# Patient Record
Sex: Female | Born: 1945 | Race: Black or African American | Hispanic: No | Marital: Married | Smoking: Former smoker
Health system: Southern US, Community
[De-identification: ages and names within clinical notes are randomized; demographics above are authoritative.]

## PROBLEM LIST (undated history)

## (undated) DIAGNOSIS — D214 Benign neoplasm of connective and other soft tissue of abdomen: Secondary | ICD-10-CM

## (undated) DIAGNOSIS — C189 Malignant neoplasm of colon, unspecified: Secondary | ICD-10-CM

## (undated) DIAGNOSIS — D249 Benign neoplasm of unspecified breast: Secondary | ICD-10-CM

## (undated) DIAGNOSIS — I10 Essential (primary) hypertension: Secondary | ICD-10-CM

## (undated) DIAGNOSIS — E119 Type 2 diabetes mellitus without complications: Secondary | ICD-10-CM

## (undated) HISTORY — DX: Benign neoplasm of connective and other soft tissue of abdomen: D21.4

## (undated) HISTORY — DX: Type 2 diabetes mellitus without complications: E11.9

## (undated) HISTORY — DX: Essential (primary) hypertension: I10

## (undated) HISTORY — DX: Malignant neoplasm of colon, unspecified: C18.9

## (undated) HISTORY — DX: Benign neoplasm of unspecified breast: D24.9

---

## 2006-05-05 ENCOUNTER — Ambulatory Visit: Payer: Self-pay | Admitting: Oncology

## 2006-05-17 LAB — COMPREHENSIVE METABOLIC PANEL
AST: 19 U/L (ref 0–37)
Alkaline Phosphatase: 87 U/L (ref 39–117)
BUN: 10 mg/dL (ref 6–23)
Creatinine, Ser: 0.88 mg/dL (ref 0.40–1.20)
Total Bilirubin: 0.6 mg/dL (ref 0.3–1.2)

## 2006-05-17 LAB — CBC WITH DIFFERENTIAL/PLATELET
BASO%: 1.5 % (ref 0.0–2.0)
Basophils Absolute: 0.1 10*3/uL (ref 0.0–0.1)
EOS%: 2.5 % (ref 0.0–7.0)
HGB: 12.9 g/dL (ref 11.6–15.9)
MCH: 29.2 pg (ref 26.0–34.0)
MCHC: 33.5 g/dL (ref 32.0–36.0)
RDW: 13.8 % (ref 11.3–14.5)
WBC: 3.9 10*3/uL (ref 3.9–10.0)
lymph#: 1.1 10*3/uL (ref 0.9–3.3)

## 2006-05-17 LAB — PROTIME-INR: INR: 1.1 — ABNORMAL LOW (ref 2.00–3.50)

## 2006-06-21 ENCOUNTER — Ambulatory Visit: Payer: Self-pay | Admitting: Oncology

## 2006-08-24 ENCOUNTER — Ambulatory Visit: Payer: Self-pay | Admitting: Oncology

## 2006-08-24 LAB — COMPREHENSIVE METABOLIC PANEL
ALT: 18 U/L (ref 0–40)
AST: 24 U/L (ref 0–37)
Albumin: 3.6 g/dL (ref 3.5–5.2)
Alkaline Phosphatase: 78 U/L (ref 39–117)
BUN: 9 mg/dL (ref 6–23)
CO2: 28 mEq/L (ref 19–32)
Calcium: 9.4 mg/dL (ref 8.4–10.5)
Chloride: 100 mEq/L (ref 96–112)
Creatinine, Ser: 0.9 mg/dL (ref 0.40–1.20)
Glucose, Bld: 149 mg/dL — ABNORMAL HIGH (ref 70–99)
Potassium: 3.6 mEq/L (ref 3.5–5.3)
Sodium: 134 mEq/L — ABNORMAL LOW (ref 135–145)
Total Bilirubin: 1.6 mg/dL — ABNORMAL HIGH (ref 0.3–1.2)
Total Protein: 6 g/dL (ref 6.0–8.3)

## 2006-08-24 LAB — CBC WITH DIFFERENTIAL/PLATELET
Basophils Absolute: 0 10*3/uL (ref 0.0–0.1)
EOS%: 1.5 % (ref 0.0–7.0)
Eosinophils Absolute: 0.1 10*3/uL (ref 0.0–0.5)
HCT: 39 % (ref 34.8–46.6)
HGB: 13.6 g/dL (ref 11.6–15.9)
MONO#: 0.7 10*3/uL (ref 0.1–0.9)
NEUT#: 3.3 10*3/uL (ref 1.5–6.5)
RDW: 26.8 % — ABNORMAL HIGH (ref 11.3–14.5)
WBC: 5.4 10*3/uL (ref 3.9–10.0)
lymph#: 1.3 10*3/uL (ref 0.9–3.3)

## 2006-09-28 LAB — COMPREHENSIVE METABOLIC PANEL
BUN: 8 mg/dL (ref 6–23)
CO2: 26 mEq/L (ref 19–32)
Calcium: 9.6 mg/dL (ref 8.4–10.5)
Chloride: 107 mEq/L (ref 96–112)
Creatinine, Ser: 0.88 mg/dL (ref 0.40–1.20)
Total Bilirubin: 0.9 mg/dL (ref 0.3–1.2)

## 2006-09-28 LAB — CBC WITH DIFFERENTIAL/PLATELET
Basophils Absolute: 0 10*3/uL (ref 0.0–0.1)
HCT: 37.2 % (ref 34.8–46.6)
HGB: 12.6 g/dL (ref 11.6–15.9)
LYMPH%: 26.2 % (ref 14.0–48.0)
MCH: 36.2 pg — ABNORMAL HIGH (ref 26.0–34.0)
MONO#: 0.6 10*3/uL (ref 0.1–0.9)
NEUT%: 60.8 % (ref 39.6–76.8)
Platelets: 235 10*3/uL (ref 145–400)
WBC: 5.2 10*3/uL (ref 3.9–10.0)
lymph#: 1.4 10*3/uL (ref 0.9–3.3)

## 2006-09-28 LAB — LACTATE DEHYDROGENASE: LDH: 188 U/L (ref 94–250)

## 2006-10-26 ENCOUNTER — Ambulatory Visit: Payer: Self-pay | Admitting: Oncology

## 2006-10-31 LAB — COMPREHENSIVE METABOLIC PANEL
ALT: 19 U/L (ref 0–35)
AST: 24 U/L (ref 0–37)
Albumin: 4.2 g/dL (ref 3.5–5.2)
Calcium: 9.2 mg/dL (ref 8.4–10.5)
Chloride: 111 mEq/L (ref 96–112)
Potassium: 3.3 mEq/L — ABNORMAL LOW (ref 3.5–5.3)
Total Protein: 6.6 g/dL (ref 6.0–8.3)

## 2006-10-31 LAB — CBC WITH DIFFERENTIAL/PLATELET
BASO%: 0.5 % (ref 0.0–2.0)
EOS%: 1 % (ref 0.0–7.0)
HGB: 12.6 g/dL (ref 11.6–15.9)
MCH: 36.6 pg — ABNORMAL HIGH (ref 26.0–34.0)
MCHC: 34.3 g/dL (ref 32.0–36.0)
RDW: 16.6 % — ABNORMAL HIGH (ref 11.3–14.5)
WBC: 5.4 10*3/uL (ref 3.9–10.0)
lymph#: 2.1 10*3/uL (ref 0.9–3.3)

## 2006-11-16 ENCOUNTER — Ambulatory Visit (HOSPITAL_COMMUNITY): Admission: RE | Admit: 2006-11-16 | Discharge: 2006-11-16 | Payer: Self-pay | Admitting: Oncology

## 2007-02-22 ENCOUNTER — Ambulatory Visit: Payer: Self-pay | Admitting: Oncology

## 2007-02-27 LAB — COMPREHENSIVE METABOLIC PANEL
ALT: 24 U/L (ref 0–35)
Albumin: 4.5 g/dL (ref 3.5–5.2)
CO2: 28 mEq/L (ref 19–32)
Chloride: 106 mEq/L (ref 96–112)
Glucose, Bld: 105 mg/dL — ABNORMAL HIGH (ref 70–99)
Potassium: 4.2 mEq/L (ref 3.5–5.3)
Sodium: 144 mEq/L (ref 135–145)
Total Bilirubin: 0.6 mg/dL (ref 0.3–1.2)
Total Protein: 7.2 g/dL (ref 6.0–8.3)

## 2007-02-27 LAB — LACTATE DEHYDROGENASE: LDH: 148 U/L (ref 94–250)

## 2007-02-27 LAB — CBC WITH DIFFERENTIAL/PLATELET
Basophils Absolute: 0.1 10*3/uL (ref 0.0–0.1)
Eosinophils Absolute: 0.1 10*3/uL (ref 0.0–0.5)
HCT: 41.1 % (ref 34.8–46.6)
HGB: 14.4 g/dL (ref 11.6–15.9)
LYMPH%: 35.8 % (ref 14.0–48.0)
MCV: 90.7 fL (ref 81.0–101.0)
MONO%: 13.5 % — ABNORMAL HIGH (ref 0.0–13.0)
NEUT#: 2.3 10*3/uL (ref 1.5–6.5)
NEUT%: 47.6 % (ref 39.6–76.8)
Platelets: 211 10*3/uL (ref 145–400)
RDW: 12.9 % (ref 11.3–14.5)

## 2007-04-14 ENCOUNTER — Encounter: Admission: RE | Admit: 2007-04-14 | Discharge: 2007-04-14 | Payer: Self-pay | Admitting: Oncology

## 2007-04-21 ENCOUNTER — Encounter: Admission: RE | Admit: 2007-04-21 | Discharge: 2007-04-21 | Payer: Self-pay | Admitting: Surgery

## 2007-04-21 ENCOUNTER — Encounter (INDEPENDENT_AMBULATORY_CARE_PROVIDER_SITE_OTHER): Payer: Self-pay | Admitting: Radiology

## 2007-06-22 ENCOUNTER — Ambulatory Visit: Payer: Self-pay | Admitting: Oncology

## 2007-06-26 LAB — CBC WITH DIFFERENTIAL/PLATELET
HGB: 13.5 g/dL (ref 11.6–15.9)
LYMPH%: 28.2 % (ref 14.0–48.0)
MCH: 31.4 pg (ref 26.0–34.0)
MCHC: 35.2 g/dL (ref 32.0–36.0)
MCV: 89.1 fL (ref 81.0–101.0)
RDW: 13.7 % (ref 11.3–14.5)

## 2007-06-26 LAB — COMPREHENSIVE METABOLIC PANEL
ALT: 30 U/L (ref 0–35)
Albumin: 4.2 g/dL (ref 3.5–5.2)
CO2: 25 mEq/L (ref 19–32)
Calcium: 9.5 mg/dL (ref 8.4–10.5)
Chloride: 108 mEq/L (ref 96–112)
Glucose, Bld: 85 mg/dL (ref 70–99)
Sodium: 143 mEq/L (ref 135–145)
Total Bilirubin: 0.7 mg/dL (ref 0.3–1.2)
Total Protein: 6.9 g/dL (ref 6.0–8.3)

## 2007-06-26 LAB — LACTATE DEHYDROGENASE: LDH: 153 U/L (ref 94–250)

## 2007-10-03 ENCOUNTER — Encounter: Admission: RE | Admit: 2007-10-03 | Discharge: 2007-10-03 | Payer: Self-pay | Admitting: Oncology

## 2007-12-14 ENCOUNTER — Ambulatory Visit: Payer: Self-pay | Admitting: Oncology

## 2007-12-18 LAB — COMPREHENSIVE METABOLIC PANEL
AST: 27 U/L (ref 0–37)
BUN: 9 mg/dL (ref 6–23)
Calcium: 9.3 mg/dL (ref 8.4–10.5)
Chloride: 105 mEq/L (ref 96–112)
Creatinine, Ser: 0.86 mg/dL (ref 0.40–1.20)

## 2007-12-18 LAB — CBC WITH DIFFERENTIAL/PLATELET
Basophils Absolute: 0 10*3/uL (ref 0.0–0.1)
EOS%: 1.7 % (ref 0.0–7.0)
HCT: 40.9 % (ref 34.8–46.6)
HGB: 13.9 g/dL (ref 11.6–15.9)
LYMPH%: 31.8 % (ref 14.0–48.0)
MCH: 30.2 pg (ref 26.0–34.0)
MCV: 88.8 fL (ref 81.0–101.0)
MONO%: 7.8 % (ref 0.0–13.0)
NEUT%: 58.2 % (ref 39.6–76.8)
Platelets: 201 10*3/uL (ref 145–400)
lymph#: 1.6 10*3/uL (ref 0.9–3.3)

## 2007-12-18 LAB — LACTATE DEHYDROGENASE: LDH: 160 U/L (ref 94–250)

## 2007-12-20 ENCOUNTER — Ambulatory Visit (HOSPITAL_COMMUNITY): Admission: RE | Admit: 2007-12-20 | Discharge: 2007-12-20 | Payer: Self-pay | Admitting: Oncology

## 2008-04-19 ENCOUNTER — Encounter: Admission: RE | Admit: 2008-04-19 | Discharge: 2008-04-19 | Payer: Self-pay | Admitting: Oncology

## 2008-06-19 ENCOUNTER — Ambulatory Visit: Payer: Self-pay | Admitting: Oncology

## 2008-06-28 LAB — CBC WITH DIFFERENTIAL/PLATELET
BASO%: 0.4 % (ref 0.0–2.0)
Basophils Absolute: 0 10*3/uL (ref 0.0–0.1)
EOS%: 2.2 % (ref 0.0–7.0)
HCT: 41.8 % (ref 34.8–46.6)
HGB: 14.4 g/dL (ref 11.6–15.9)
MONO#: 0.6 10*3/uL (ref 0.1–0.9)
NEUT%: 57.8 % (ref 39.6–76.8)
RDW: 13.8 % (ref 11.3–14.5)
WBC: 5.4 10*3/uL (ref 3.9–10.0)
lymph#: 1.5 10*3/uL (ref 0.9–3.3)

## 2008-06-28 LAB — COMPREHENSIVE METABOLIC PANEL
ALT: 17 U/L (ref 0–35)
AST: 15 U/L (ref 0–37)
Albumin: 4.6 g/dL (ref 3.5–5.2)
BUN: 15 mg/dL (ref 6–23)
CO2: 28 mEq/L (ref 19–32)
Calcium: 9.6 mg/dL (ref 8.4–10.5)
Chloride: 104 mEq/L (ref 96–112)
Creatinine, Ser: 1.08 mg/dL (ref 0.40–1.20)
Potassium: 4 mEq/L (ref 3.5–5.3)

## 2008-06-28 LAB — LACTATE DEHYDROGENASE: LDH: 139 U/L (ref 94–250)

## 2008-06-28 LAB — CEA: CEA: 1.3 ng/mL (ref 0.0–5.0)

## 2008-07-05 ENCOUNTER — Ambulatory Visit (HOSPITAL_COMMUNITY): Admission: RE | Admit: 2008-07-05 | Discharge: 2008-07-05 | Payer: Self-pay | Admitting: Oncology

## 2008-12-27 ENCOUNTER — Ambulatory Visit: Payer: Self-pay | Admitting: Oncology

## 2009-02-25 ENCOUNTER — Ambulatory Visit: Payer: Self-pay | Admitting: Oncology

## 2009-02-28 LAB — COMPREHENSIVE METABOLIC PANEL
Alkaline Phosphatase: 88 U/L (ref 39–117)
BUN: 9 mg/dL (ref 6–23)
CO2: 31 mEq/L (ref 19–32)
Creatinine, Ser: 0.91 mg/dL (ref 0.40–1.20)
Glucose, Bld: 143 mg/dL — ABNORMAL HIGH (ref 70–99)
Total Bilirubin: 0.6 mg/dL (ref 0.3–1.2)

## 2009-02-28 LAB — CBC WITH DIFFERENTIAL/PLATELET
BASO%: 0.9 % (ref 0.0–2.0)
LYMPH%: 29.2 % (ref 14.0–49.7)
MCHC: 34.4 g/dL (ref 31.5–36.0)
MCV: 88.2 fL (ref 79.5–101.0)
MONO%: 9 % (ref 0.0–14.0)
NEUT%: 59.2 % (ref 38.4–76.8)
Platelets: 184 10*3/uL (ref 145–400)
RBC: 4.36 10*6/uL (ref 3.70–5.45)

## 2009-02-28 LAB — CEA: CEA: 1.3 ng/mL (ref 0.0–5.0)

## 2009-02-28 LAB — LACTATE DEHYDROGENASE: LDH: 125 U/L (ref 94–250)

## 2009-03-11 ENCOUNTER — Ambulatory Visit (HOSPITAL_COMMUNITY): Admission: RE | Admit: 2009-03-11 | Discharge: 2009-03-11 | Payer: Self-pay | Admitting: Oncology

## 2009-09-17 ENCOUNTER — Ambulatory Visit: Payer: Self-pay | Admitting: Oncology

## 2009-09-19 LAB — COMPREHENSIVE METABOLIC PANEL
ALT: 15 U/L (ref 0–35)
AST: 17 U/L (ref 0–37)
Albumin: 4.2 g/dL (ref 3.5–5.2)
Calcium: 9.7 mg/dL (ref 8.4–10.5)
Chloride: 100 mEq/L (ref 96–112)
Potassium: 3.6 mEq/L (ref 3.5–5.3)
Sodium: 139 mEq/L (ref 135–145)
Total Protein: 7.3 g/dL (ref 6.0–8.3)

## 2009-09-19 LAB — CBC WITH DIFFERENTIAL/PLATELET
BASO%: 0.3 % (ref 0.0–2.0)
Basophils Absolute: 0 10*3/uL (ref 0.0–0.1)
EOS%: 1.2 % (ref 0.0–7.0)
HGB: 13.5 g/dL (ref 11.6–15.9)
MCH: 30.1 pg (ref 25.1–34.0)
RBC: 4.48 10*6/uL (ref 3.70–5.45)
RDW: 13.2 % (ref 11.2–14.5)
lymph#: 1.9 10*3/uL (ref 0.9–3.3)

## 2010-03-13 ENCOUNTER — Ambulatory Visit: Payer: Self-pay | Admitting: Oncology

## 2010-03-13 ENCOUNTER — Ambulatory Visit (HOSPITAL_COMMUNITY): Admission: RE | Admit: 2010-03-13 | Discharge: 2010-03-13 | Payer: Self-pay | Admitting: Oncology

## 2010-03-13 LAB — CBC WITH DIFFERENTIAL/PLATELET
BASO%: 0.7 % (ref 0.0–2.0)
EOS%: 2.4 % (ref 0.0–7.0)
HCT: 41 % (ref 34.8–46.6)
HGB: 13.9 g/dL (ref 11.6–15.9)
LYMPH%: 35 % (ref 14.0–49.7)
MCV: 90 fL (ref 79.5–101.0)
NEUT#: 2.7 10*3/uL (ref 1.5–6.5)
WBC: 5.3 10*3/uL (ref 3.9–10.3)

## 2010-03-13 LAB — COMPREHENSIVE METABOLIC PANEL
ALT: 25 U/L (ref 0–35)
AST: 25 U/L (ref 0–37)
Calcium: 9.7 mg/dL (ref 8.4–10.5)
Chloride: 100 mEq/L (ref 96–112)
Glucose, Bld: 224 mg/dL — ABNORMAL HIGH (ref 70–99)
Potassium: 3.7 mEq/L (ref 3.5–5.3)
Total Bilirubin: 0.7 mg/dL (ref 0.3–1.2)

## 2010-03-13 LAB — LACTATE DEHYDROGENASE: LDH: 134 U/L (ref 94–250)

## 2010-09-09 ENCOUNTER — Ambulatory Visit: Payer: Self-pay | Admitting: Oncology

## 2010-09-11 LAB — COMPREHENSIVE METABOLIC PANEL
ALT: 11 U/L (ref 0–35)
Calcium: 10.4 mg/dL (ref 8.4–10.5)
Chloride: 106 mEq/L (ref 96–112)
Creatinine, Ser: 0.85 mg/dL (ref 0.40–1.20)
Glucose, Bld: 104 mg/dL — ABNORMAL HIGH (ref 70–99)
Potassium: 4 mEq/L (ref 3.5–5.3)
Sodium: 140 mEq/L (ref 135–145)

## 2010-09-11 LAB — CBC WITH DIFFERENTIAL/PLATELET
BASO%: 0.4 % (ref 0.0–2.0)
Basophils Absolute: 0 10*3/uL (ref 0.0–0.1)
EOS%: 1.9 % (ref 0.0–7.0)
MCHC: 33.4 g/dL (ref 31.5–36.0)
MONO%: 10.8 % (ref 0.0–14.0)
NEUT#: 2.8 10*3/uL (ref 1.5–6.5)
NEUT%: 55.8 % (ref 38.4–76.8)
Platelets: 237 10*3/uL (ref 145–400)
lymph#: 1.6 10*3/uL (ref 0.9–3.3)

## 2010-09-11 LAB — CEA: CEA: 1.4 ng/mL (ref 0.0–5.0)

## 2010-12-19 ENCOUNTER — Other Ambulatory Visit: Payer: Self-pay | Admitting: Oncology

## 2010-12-19 DIAGNOSIS — C189 Malignant neoplasm of colon, unspecified: Secondary | ICD-10-CM

## 2010-12-20 ENCOUNTER — Encounter: Payer: Self-pay | Admitting: Oncology

## 2011-03-22 ENCOUNTER — Other Ambulatory Visit: Payer: Self-pay | Admitting: Oncology

## 2011-03-22 ENCOUNTER — Encounter (HOSPITAL_BASED_OUTPATIENT_CLINIC_OR_DEPARTMENT_OTHER): Payer: BC Managed Care – PPO | Admitting: Oncology

## 2011-03-22 DIAGNOSIS — C189 Malignant neoplasm of colon, unspecified: Secondary | ICD-10-CM

## 2011-03-22 DIAGNOSIS — C187 Malignant neoplasm of sigmoid colon: Secondary | ICD-10-CM

## 2011-03-22 DIAGNOSIS — E119 Type 2 diabetes mellitus without complications: Secondary | ICD-10-CM

## 2011-03-22 LAB — CBC WITH DIFFERENTIAL/PLATELET
Basophils Absolute: 0.1 10*3/uL (ref 0.0–0.1)
EOS%: 1.7 % (ref 0.0–7.0)
Eosinophils Absolute: 0.1 10*3/uL (ref 0.0–0.5)
LYMPH%: 25.2 % (ref 14.0–49.7)
MCH: 30.1 pg (ref 25.1–34.0)
MCHC: 33.9 g/dL (ref 31.5–36.0)
MONO#: 0.3 10*3/uL (ref 0.1–0.9)
NEUT#: 3 10*3/uL (ref 1.5–6.5)
NEUT%: 63.5 % (ref 38.4–76.8)
WBC: 4.8 10*3/uL (ref 3.9–10.3)

## 2011-03-22 LAB — COMPREHENSIVE METABOLIC PANEL
ALT: 16 U/L (ref 0–35)
Albumin: 4 g/dL (ref 3.5–5.2)
Glucose, Bld: 210 mg/dL — ABNORMAL HIGH (ref 70–99)
Potassium: 3.5 mEq/L (ref 3.5–5.3)
Sodium: 142 mEq/L (ref 135–145)
Total Bilirubin: 0.5 mg/dL (ref 0.3–1.2)
Total Protein: 7.1 g/dL (ref 6.0–8.3)

## 2011-04-16 ENCOUNTER — Encounter (HOSPITAL_COMMUNITY): Payer: Self-pay

## 2011-04-16 ENCOUNTER — Ambulatory Visit (HOSPITAL_COMMUNITY)
Admission: RE | Admit: 2011-04-16 | Discharge: 2011-04-16 | Disposition: A | Discharge status: Home / Self Care | Payer: BC Managed Care – PPO | Source: Ambulatory Visit | Attending: Oncology | Admitting: Oncology

## 2011-04-16 DIAGNOSIS — J984 Other disorders of lung: Secondary | ICD-10-CM | POA: Insufficient documentation

## 2011-04-16 DIAGNOSIS — C189 Malignant neoplasm of colon, unspecified: Secondary | ICD-10-CM | POA: Insufficient documentation

## 2011-04-16 DIAGNOSIS — D259 Leiomyoma of uterus, unspecified: Secondary | ICD-10-CM | POA: Insufficient documentation

## 2011-04-16 DIAGNOSIS — Q618 Other cystic kidney diseases: Secondary | ICD-10-CM | POA: Insufficient documentation

## 2011-04-16 DIAGNOSIS — M7989 Other specified soft tissue disorders: Secondary | ICD-10-CM | POA: Insufficient documentation

## 2011-04-16 HISTORY — DX: Malignant neoplasm of colon, unspecified: C18.9

## 2011-04-16 HISTORY — DX: Essential (primary) hypertension: I10

## 2011-04-16 MED ORDER — IOHEXOL 300 MG/ML  SOLN
100.0000 mL | Freq: Once | INTRAMUSCULAR | Status: AC | PRN
  Administered 2011-04-16: 100 mL via INTRAVENOUS

## 2011-09-06 ENCOUNTER — Other Ambulatory Visit (HOSPITAL_COMMUNITY): Payer: Self-pay

## 2011-09-24 ENCOUNTER — Other Ambulatory Visit: Payer: Self-pay | Admitting: Oncology

## 2011-09-24 ENCOUNTER — Encounter (HOSPITAL_BASED_OUTPATIENT_CLINIC_OR_DEPARTMENT_OTHER): Payer: BC Managed Care – PPO | Admitting: Oncology

## 2011-09-24 DIAGNOSIS — C187 Malignant neoplasm of sigmoid colon: Secondary | ICD-10-CM

## 2011-09-24 DIAGNOSIS — Z85038 Personal history of other malignant neoplasm of large intestine: Secondary | ICD-10-CM

## 2011-09-24 DIAGNOSIS — E119 Type 2 diabetes mellitus without complications: Secondary | ICD-10-CM

## 2011-09-24 LAB — CBC WITH DIFFERENTIAL/PLATELET
BASO%: 1.7 % (ref 0.0–2.0)
EOS%: 1.1 % (ref 0.0–7.0)
HCT: 38.8 % (ref 34.8–46.6)
LYMPH%: 27.9 % (ref 14.0–49.7)
MCH: 29.7 pg (ref 25.1–34.0)
MCHC: 33.8 g/dL (ref 31.5–36.0)
MCV: 87.9 fL (ref 79.5–101.0)
MONO%: 8.3 % (ref 0.0–14.0)
NEUT%: 61 % (ref 38.4–76.8)
Platelets: 241 10*3/uL (ref 145–400)
RBC: 4.41 10*6/uL (ref 3.70–5.45)
WBC: 5.7 10*3/uL (ref 3.9–10.3)

## 2011-09-24 LAB — COMPREHENSIVE METABOLIC PANEL
ALT: 14 U/L (ref 0–35)
AST: 16 U/L (ref 0–37)
Alkaline Phosphatase: 83 U/L (ref 39–117)
CO2: 23 mEq/L (ref 19–32)
Creatinine, Ser: 0.84 mg/dL (ref 0.50–1.10)
Sodium: 139 mEq/L (ref 135–145)
Total Bilirubin: 0.4 mg/dL (ref 0.3–1.2)
Total Protein: 7 g/dL (ref 6.0–8.3)

## 2011-09-24 LAB — LACTATE DEHYDROGENASE: LDH: 127 U/L (ref 94–250)

## 2012-08-25 ENCOUNTER — Telehealth: Payer: Self-pay | Admitting: Oncology

## 2012-08-25 NOTE — Telephone Encounter (Signed)
lmonvm adviisng the pt of her nov 2013 appts

## 2012-10-25 ENCOUNTER — Other Ambulatory Visit: Payer: Self-pay | Admitting: *Deleted

## 2012-10-25 DIAGNOSIS — C189 Malignant neoplasm of colon, unspecified: Secondary | ICD-10-CM

## 2012-10-27 ENCOUNTER — Other Ambulatory Visit (HOSPITAL_BASED_OUTPATIENT_CLINIC_OR_DEPARTMENT_OTHER): Payer: BC Managed Care – PPO

## 2012-10-27 ENCOUNTER — Telehealth: Payer: Self-pay | Admitting: Oncology

## 2012-10-27 ENCOUNTER — Encounter: Payer: Self-pay | Admitting: Oncology

## 2012-10-27 ENCOUNTER — Ambulatory Visit (HOSPITAL_BASED_OUTPATIENT_CLINIC_OR_DEPARTMENT_OTHER): Payer: BC Managed Care – PPO | Admitting: Oncology

## 2012-10-27 VITALS — BP 134/73 | HR 93 | Temp 98.2°F | Resp 20 | Ht 66.0 in | Wt 179.8 lb

## 2012-10-27 DIAGNOSIS — D214 Benign neoplasm of connective and other soft tissue of abdomen: Secondary | ICD-10-CM | POA: Insufficient documentation

## 2012-10-27 DIAGNOSIS — C187 Malignant neoplasm of sigmoid colon: Secondary | ICD-10-CM

## 2012-10-27 DIAGNOSIS — E119 Type 2 diabetes mellitus without complications: Secondary | ICD-10-CM

## 2012-10-27 DIAGNOSIS — D249 Benign neoplasm of unspecified breast: Secondary | ICD-10-CM | POA: Insufficient documentation

## 2012-10-27 DIAGNOSIS — C189 Malignant neoplasm of colon, unspecified: Secondary | ICD-10-CM

## 2012-10-27 DIAGNOSIS — I10 Essential (primary) hypertension: Secondary | ICD-10-CM

## 2012-10-27 HISTORY — DX: Malignant neoplasm of colon, unspecified: C18.9

## 2012-10-27 HISTORY — DX: Type 2 diabetes mellitus without complications: E11.9

## 2012-10-27 HISTORY — DX: Benign neoplasm of unspecified breast: D24.9

## 2012-10-27 HISTORY — DX: Benign neoplasm of connective and other soft tissue of abdomen: D21.4

## 2012-10-27 HISTORY — DX: Essential (primary) hypertension: I10

## 2012-10-27 LAB — CBC WITH DIFFERENTIAL/PLATELET
BASO%: 0.9 % (ref 0.0–2.0)
EOS%: 1.4 % (ref 0.0–7.0)
LYMPH%: 28.9 % (ref 14.0–49.7)
MCH: 30.3 pg (ref 25.1–34.0)
MCHC: 33.8 g/dL (ref 31.5–36.0)
MCV: 89.7 fL (ref 79.5–101.0)
MONO%: 8.4 % (ref 0.0–14.0)
Platelets: 208 10*3/uL (ref 145–400)
RBC: 4.37 10*6/uL (ref 3.70–5.45)

## 2012-10-27 LAB — COMPREHENSIVE METABOLIC PANEL (CC13)
Albumin: 3.9 g/dL (ref 3.5–5.0)
CO2: 26 mEq/L (ref 22–29)
Chloride: 110 mEq/L — ABNORMAL HIGH (ref 98–107)
Glucose: 120 mg/dl — ABNORMAL HIGH (ref 70–99)
Potassium: 4.3 mEq/L (ref 3.5–5.1)
Sodium: 143 mEq/L (ref 136–145)
Total Protein: 6.9 g/dL (ref 6.4–8.3)

## 2012-10-27 LAB — LACTATE DEHYDROGENASE (CC13): LDH: 142 U/L (ref 125–245)

## 2012-10-27 NOTE — Progress Notes (Signed)
Hematology and Oncology Follow Up Visit  Shirley Fowler [id number] 1945/12/17 66 y.o. 10/27/2012 12:19 PM   Principle Diagnosis:No diagnosis found.   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   6-cm primary, grade not stated in report, 17 lymph nodes negative.  Preoperative CEA normal.  In addition to the primary tumor, she had a number of tubular adenomas and rectal polyps, some with dysplasia with additional polyps in the ascending colon.    Following surgical resection, she was treated with adjuvant oral Xeloda chemotherapy.    She did not want to participate in a clinical trial.  She developed some rectal bleeding in January of 2010.  She was found to have a number of polyps in her colon and one large soft tissue mass in the transverse colon.  She underwent another surgical procedure.  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    Most recent follow up colonoscopy on 02/20/10 by Dr. Volney Presser.  She was told she had two small polyps which were removed.  Otherwise the study was unremarkable.    Medications: reviewed  Allergies: No Known Allergies  Review of Systems: Constitutional:   No constitutional symptoms  Respiratory: No cough or dyspnea Cardiovascular:  No chest pain or palpitations Gastrointestinal: No abdominal pain or cramps, no change in bowel habit, no hematochezia or melena  Genito-Urinary: Not questioned Musculoskeletal: No muscle or bone pain  Neurologic: No headache or change in vision Skin: No rash Remaining ROS negative.  Physical Exam: Blood pressure 134/73, pulse 93, temperature 98.2 F (36.8 C), temperature source Oral, resp. rate 20, height 5\' 6"  (1.676 m), weight 179 lb 12.8 oz (81.557 kg). Wt Readings from Last 3 Encounters:  10/27/12 179 lb 12.8 oz (81.557 kg)     General appearance:  Well-nourished African American woman [name]: Pharynx no erythema or exudate Lymph nodes: No adenopathy Breasts: Not examined Lungs: Clear to auscultation resonant to percussion Heart: Regular rhythm no murmur Abdomen: Soft, nontender, no mass, no organomegaly Extremities: No edema, no calf tenderness Vascular: No cyanosis Neurologic: Motor strength 5 over 5, reflexes 1+ symmetric Skin: Birthmark with hyperpigmentation left side of her face and forehead  Lab Results: Lab Results  Component Value Date   WBC 5.4 10/27/2012   HGB 13.2 10/27/2012   HCT 39.2 10/27/2012   MCV 89.7 10/27/2012   PLT 208 10/27/2012     Chemistry      Component Value Date/Time   NA 139 09/24/2011 1017   NA 139 09/24/2011 1017   K 4.2 09/24/2011 1017   K 4.2 09/24/2011 1017   CL 106 09/24/2011 1017   CL 106 09/24/2011 1017   CO2 23 09/24/2011 1017   CO2 23 09/24/2011 1017   BUN 9 09/24/2011 1017   BUN 9 09/24/2011 1017   CREATININE 0.84 09/24/2011 1017   CREATININE 0.84 09/24/2011 1017      Component Value Date/Time   CALCIUM 10.1 09/24/2011 1017   CALCIUM 10.1 09/24/2011 1017   ALKPHOS 83 09/24/2011 1017   ALKPHOS 83 09/24/2011 1017   AST 16 09/24/2011 1017   AST 16 09/24/2011 1017   ALT 14 09/24/2011 1017   ALT 14 09/24/2011 1017   BILITOT 0.4 09/24/2011 1017   BILITOT 0.4 09/24/2011 1017       Impression and Plan: #1.  Stage II cancer of the sigmoid colon she remains free of any obvious recurrence now out over 6 years from diagnosis in May 2007. Next colonoscopy due in the spring of 2016  #2. 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 Fibrocystic disease of the breast. Benign sclerosing papillomas right breast on biopsy May, 2009 She tells me she had a mammogram in October and this was reported to her as normal  #4. Essential hypertension  #5. Type 2 diabetes on oral agent   CC:. Dr Orma Flaming   Levert Feinstein, MD 11/29/201312:19 PM

## 2012-10-27 NOTE — Telephone Encounter (Signed)
appts made and printed for pt aom °

## 2012-10-31 ENCOUNTER — Telehealth: Payer: Self-pay | Admitting: *Deleted

## 2012-10-31 NOTE — Telephone Encounter (Signed)
Pt notified of lab results & copy will be sent to Dr. Volney Presser.

## 2012-10-31 NOTE — Telephone Encounter (Signed)
Message copied by Sabino Snipes on Tue Oct 31, 2012  4:47 PM ------      Message from: Levert Feinstein      Created: Fri Oct 27, 2012  1:13 PM       Call pt lab good  Please send cc to Dr Marijo File in [facility]

## 2012-12-27 IMAGING — CR DG CHEST 2V
2 series · 2 of 2 positions shown · non-contrast
Comparison: Chest x-ray 12/20/2007.

CLINICAL DATA: History of colon cancer and gastrointestinal stromal
tumor.

CHEST - 2 VIEW

[w chest pa]
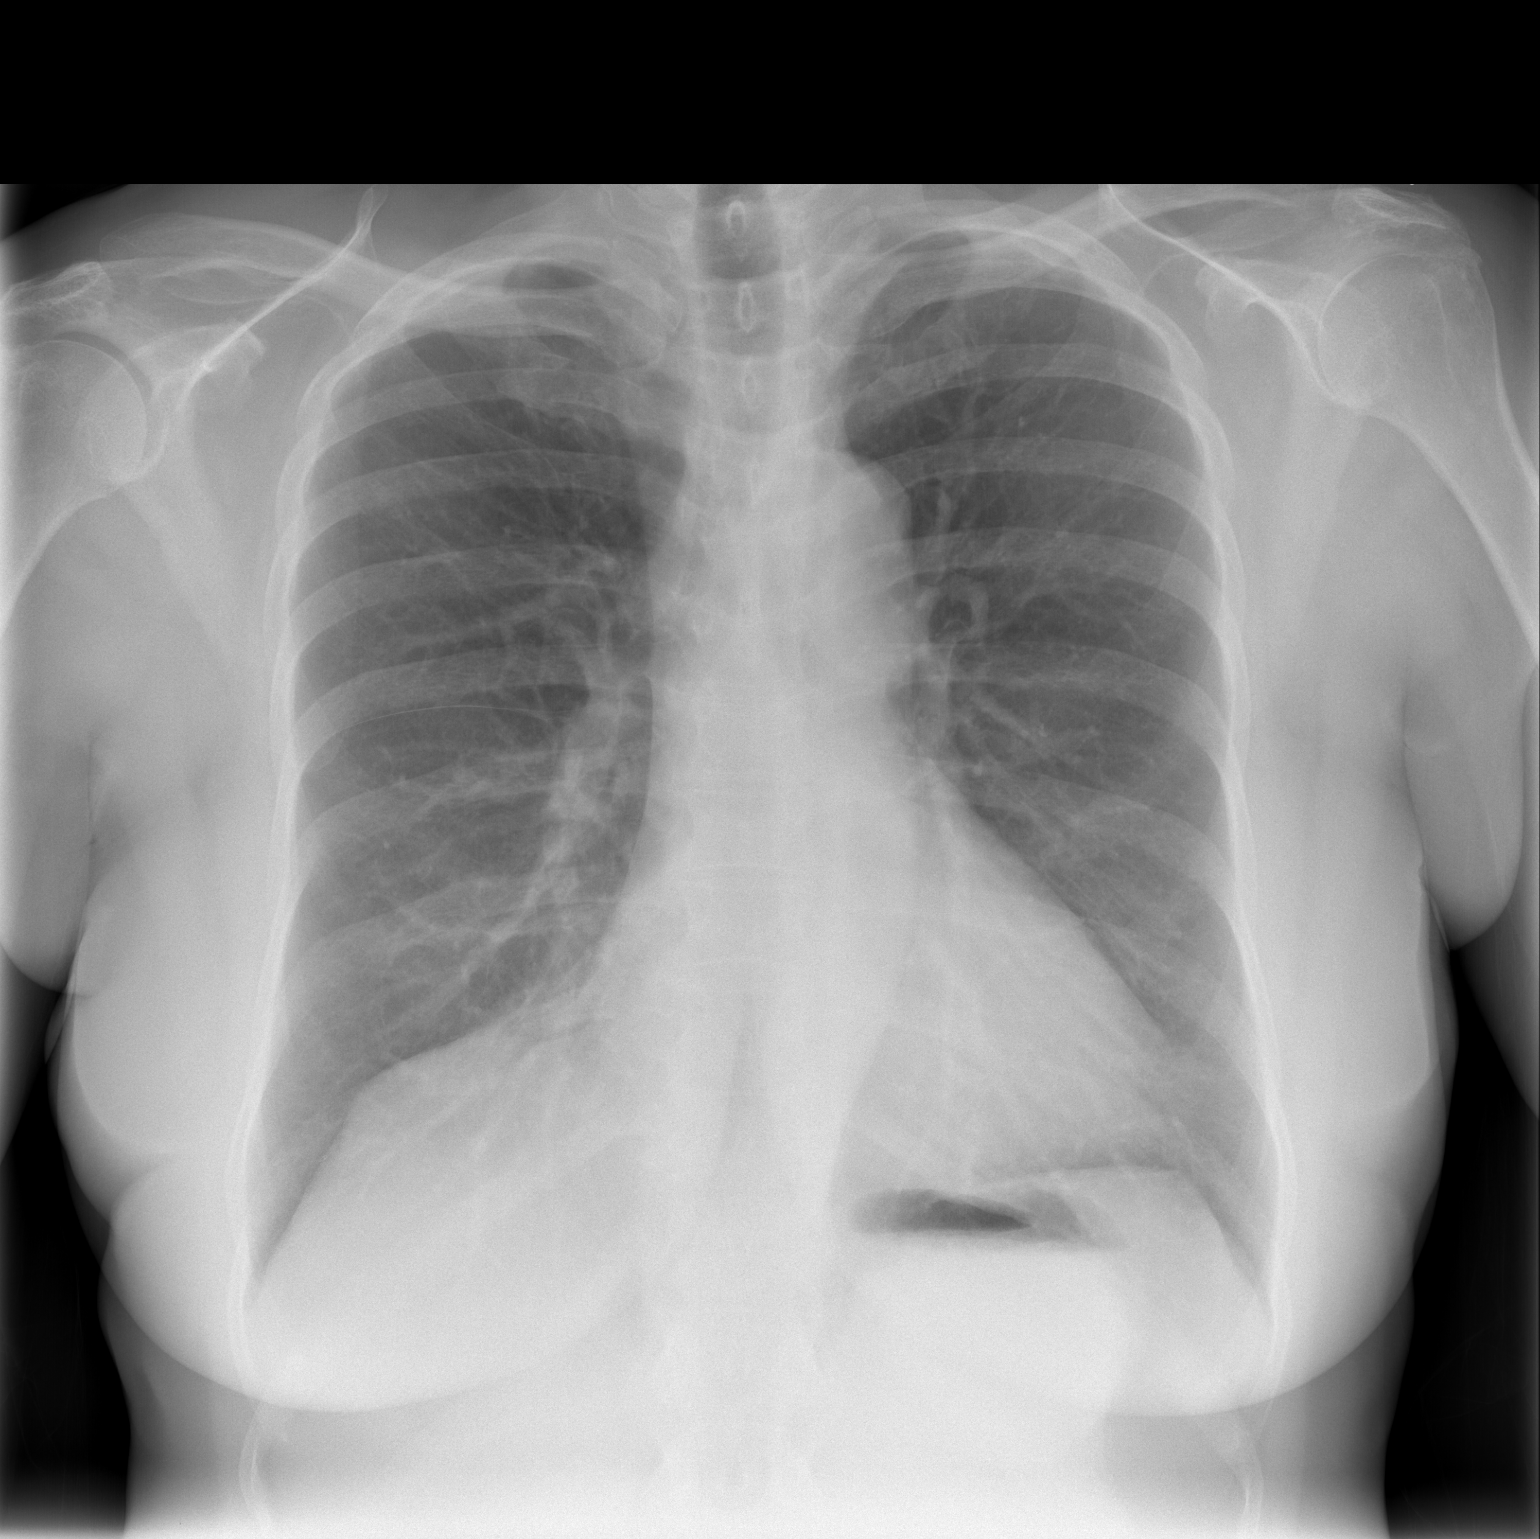

[w chest lat]
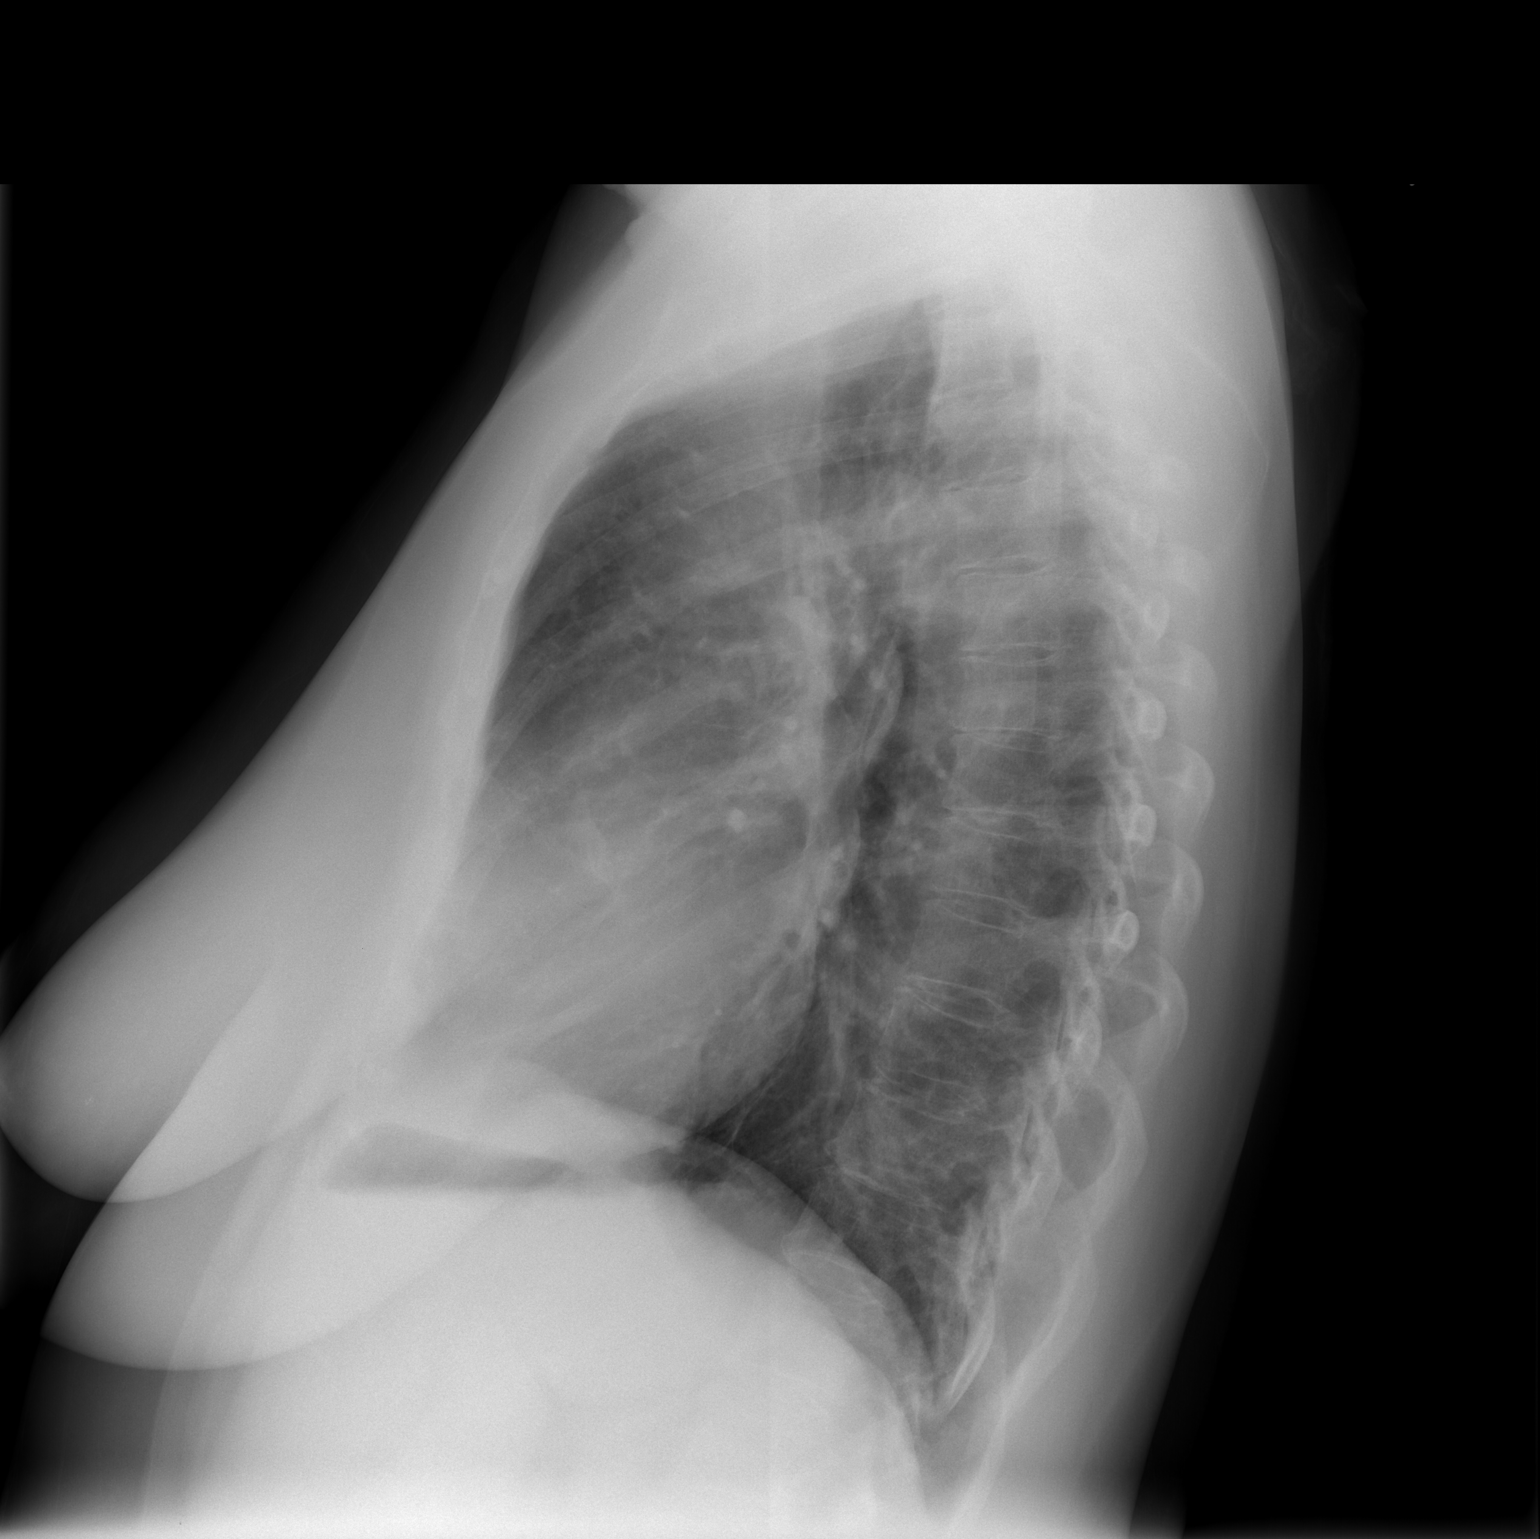

[2 of 2 positions shown; findings below may reference images not displayed]

FINDINGS: The cardiac silhouette, mediastinal and hilar contours
are within normal limits and stable.  The lungs are clear.  The
bony thorax is intact.
IMPRESSION: No acute cardiopulmonary findings.  No change since prior
examination.

## 2012-12-27 IMAGING — CT CT ABD-PELV W/ CM
2 of 5 series · 17 of 46 positions shown, 19 images · IV contrast (agent unspecified)
Comparison: Prior examinations 03/11/2009 and 03/13/2010.

CLINICAL DATA: Restaging colon cancer diagnosed in 9001 status post
resection and chemotherapy.

CT ABDOMEN AND PELVIS WITH CONTRAST
TECHNIQUE: Multidetector CT imaging of the abdomen and pelvis was
performed following the standard protocol during bolus
administration of intravenous contrast.
Contrast: 100 ml Rmnipaque-R55 intravenously.

[Series 2: rtn a/p with · axial · 0.71mm/px · z∈[-480,-70]mm · 14 of 92 slices shown, 16 images]
[im 5/92  soft-tissue]
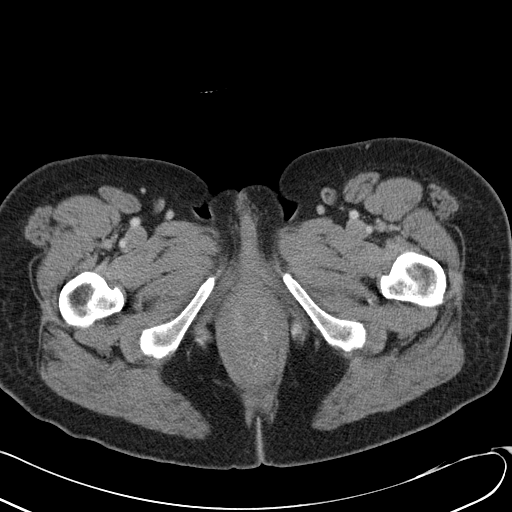
[im 5/92  bone]
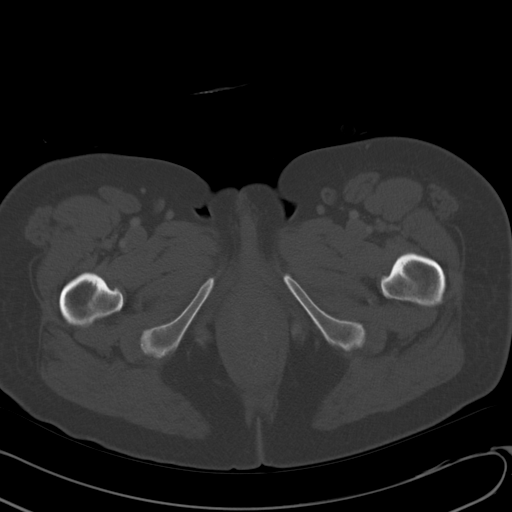
[im 14/92  soft-tissue]
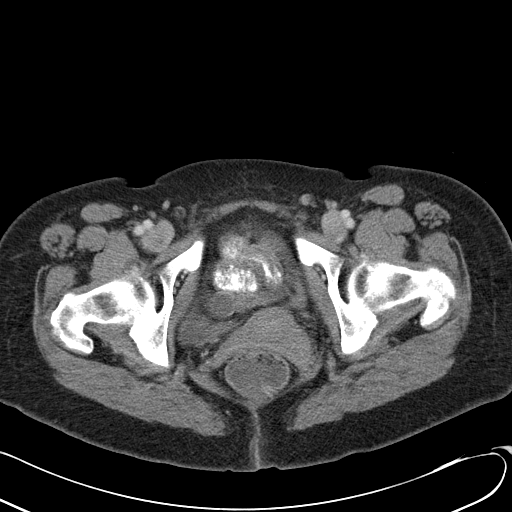
[im 19/92  soft-tissue]
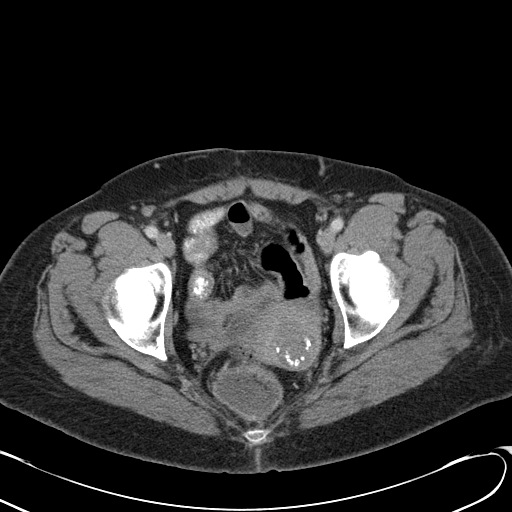
[im 23/92  soft-tissue]
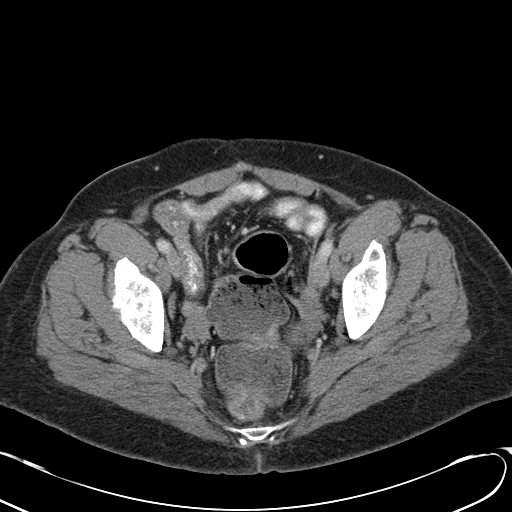
[im 32/92  soft-tissue]
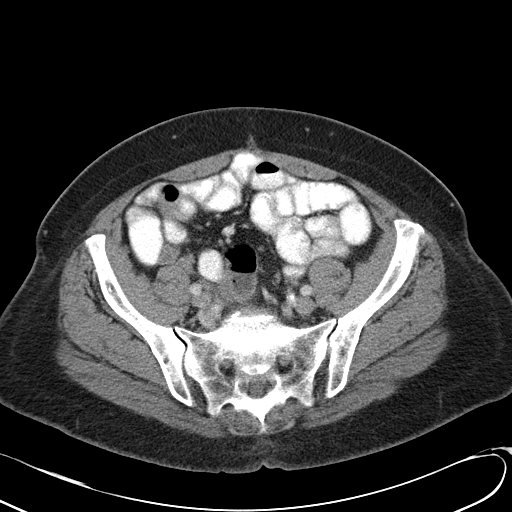
[im 37/92  soft-tissue]
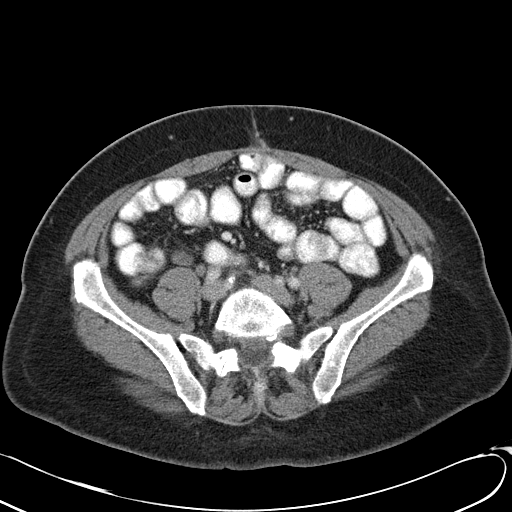
[im 41/92  soft-tissue]
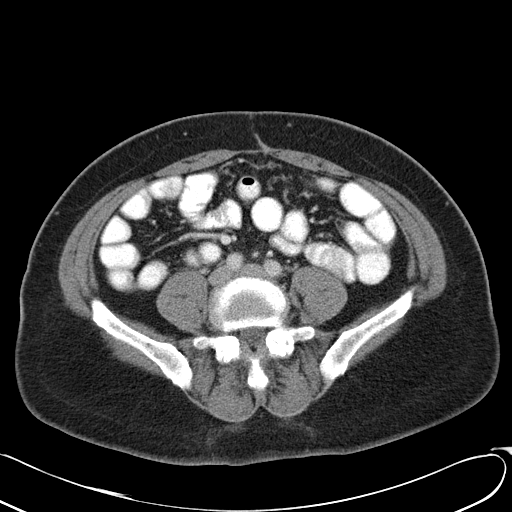
[im 51/92  soft-tissue]
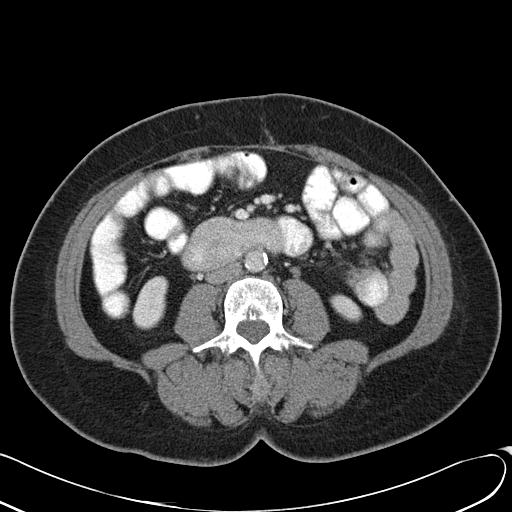
[im 55/92  soft-tissue]
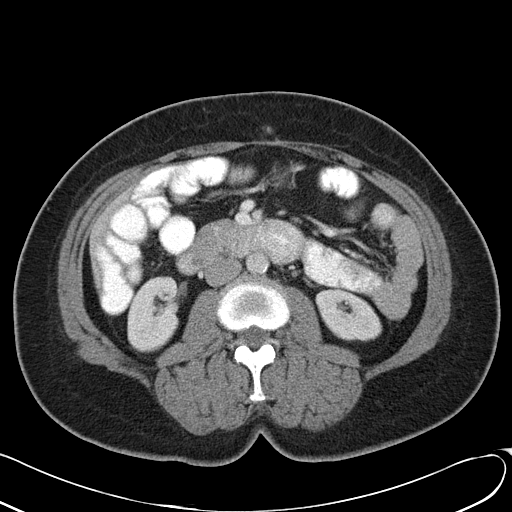
[im 55/92  bone]
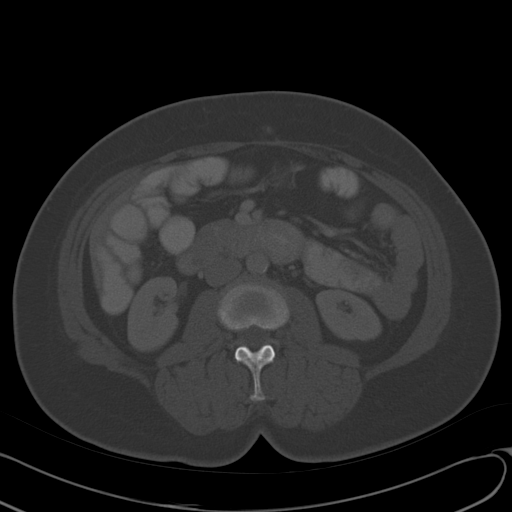
[im 60/92  soft-tissue]
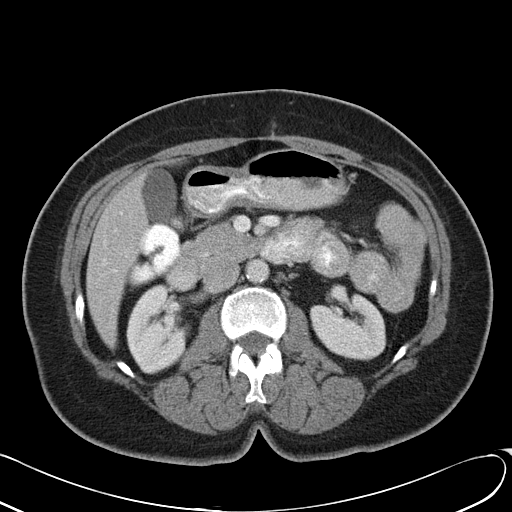
[im 69/92  soft-tissue]
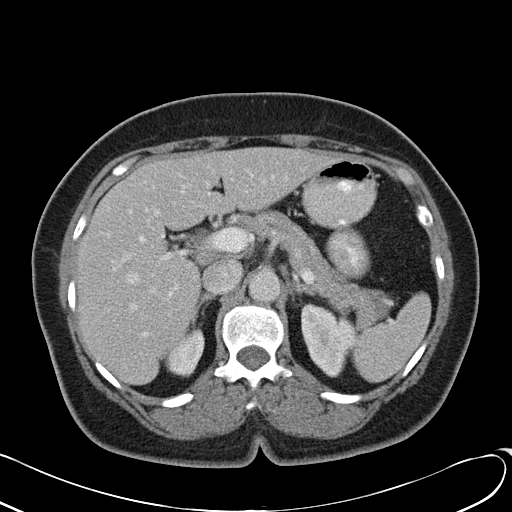
[im 73/92  soft-tissue]
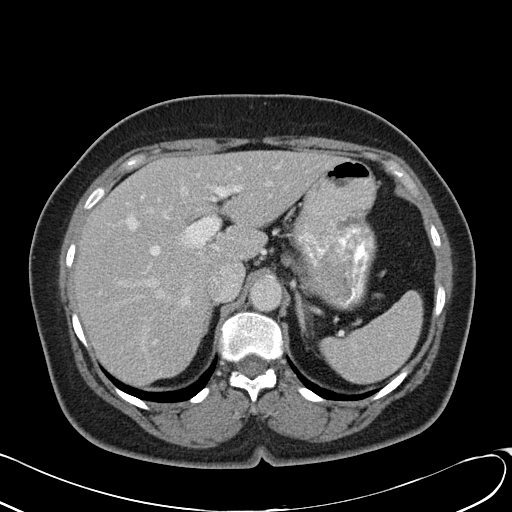
[im 78/92  soft-tissue]
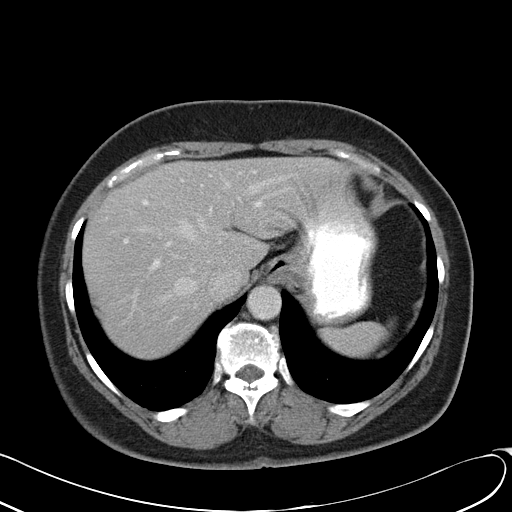
[im 87/92  soft-tissue]
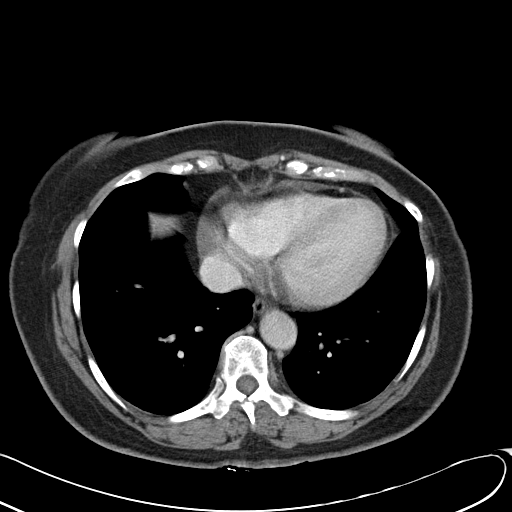

[Series 602: <mpr thick range> · coronal · 0.89mm/px · 3 of 84 slices shown]
[im 28/84  soft-tissue]
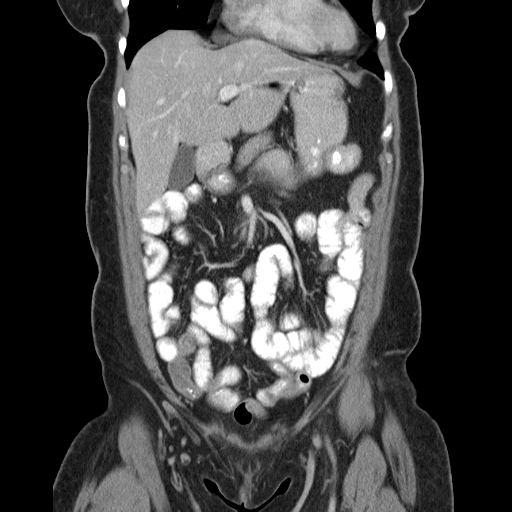
[im 37/84  soft-tissue]
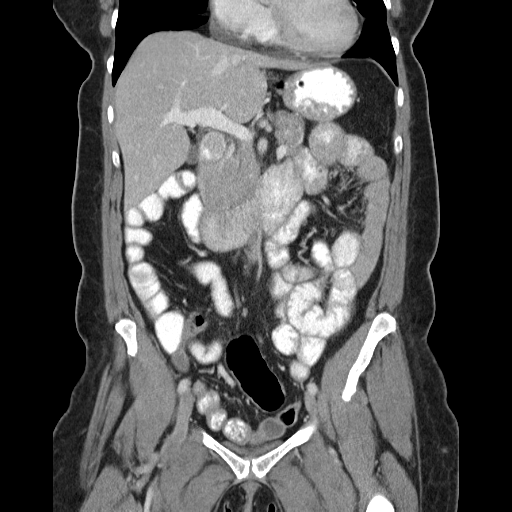
[im 47/84  soft-tissue]
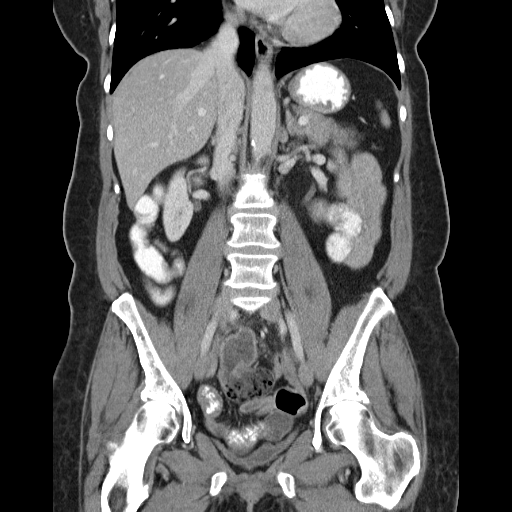

[17 of 46 positions shown; findings below may reference images not displayed]

FINDINGS: There is stable linear scarring in the lingula and right
middle lobe.  No pleural effusion is present.

The liver has a stable appearance without evidence of metastatic
disease.  There are stable small left renal cysts.  The 1.5 cm soft
tissue nodule the left upper quadrant lying between the adrenal
gland and pancreatic tail is unchanged from multiple prior studies,
consistent with a benign finding.  The pancreas, adrenal glands,
spleen and gallbladder otherwise appear unremarkable.

There are stable postsurgical changes status post subtotal
colectomy.  No enlarged abdominal pelvic lymph nodes are
identified.  Calcified uterine fibroid appears stable.  There is no
adnexal mass.  The bladder is nearly empty and suboptimally
evaluated.  There are no acute or suspicious osseous findings.
There are mild degenerative changes of the hips and lumbar spine.
IMPRESSION: Stable abdominal pelvic CT status post subtotal colectomy.  No
evidence of local recurrence or metastatic disease.

## 2013-10-26 ENCOUNTER — Telehealth: Payer: Self-pay | Admitting: Oncology

## 2013-10-26 ENCOUNTER — Other Ambulatory Visit (HOSPITAL_BASED_OUTPATIENT_CLINIC_OR_DEPARTMENT_OTHER): Payer: BC Managed Care – PPO | Admitting: Lab

## 2013-10-26 ENCOUNTER — Ambulatory Visit (HOSPITAL_BASED_OUTPATIENT_CLINIC_OR_DEPARTMENT_OTHER): Payer: BC Managed Care – PPO | Admitting: Oncology

## 2013-10-26 VITALS — BP 132/80 | HR 88 | Temp 98.2°F | Resp 20 | Ht 66.0 in | Wt 170.1 lb

## 2013-10-26 DIAGNOSIS — N6019 Diffuse cystic mastopathy of unspecified breast: Secondary | ICD-10-CM

## 2013-10-26 DIAGNOSIS — D241 Benign neoplasm of right breast: Secondary | ICD-10-CM

## 2013-10-26 DIAGNOSIS — D214 Benign neoplasm of connective and other soft tissue of abdomen: Secondary | ICD-10-CM

## 2013-10-26 DIAGNOSIS — C189 Malignant neoplasm of colon, unspecified: Secondary | ICD-10-CM

## 2013-10-26 DIAGNOSIS — I10 Essential (primary) hypertension: Secondary | ICD-10-CM

## 2013-10-26 DIAGNOSIS — Z85038 Personal history of other malignant neoplasm of large intestine: Secondary | ICD-10-CM

## 2013-10-26 DIAGNOSIS — E119 Type 2 diabetes mellitus without complications: Secondary | ICD-10-CM

## 2013-10-26 LAB — CBC WITH DIFFERENTIAL/PLATELET
BASO%: 0.2 % (ref 0.0–2.0)
EOS%: 1.6 % (ref 0.0–7.0)
LYMPH%: 36.5 % (ref 14.0–49.7)
MCHC: 32.7 g/dL (ref 31.5–36.0)
MONO#: 0.4 10*3/uL (ref 0.1–0.9)
RBC: 4.41 10*6/uL (ref 3.70–5.45)
WBC: 5 10*3/uL (ref 3.9–10.3)
lymph#: 1.8 10*3/uL (ref 0.9–3.3)

## 2013-10-26 LAB — COMPREHENSIVE METABOLIC PANEL (CC13)
ALT: 15 U/L (ref 0–55)
Albumin: 3.9 g/dL (ref 3.5–5.0)
Anion Gap: 9 mEq/L (ref 3–11)
CO2: 24 mEq/L (ref 22–29)
Creatinine: 0.9 mg/dL (ref 0.6–1.1)
Glucose: 106 mg/dl (ref 70–140)
Total Bilirubin: 0.47 mg/dL (ref 0.20–1.20)

## 2013-10-26 NOTE — Progress Notes (Signed)
Hematology and Oncology Follow Up Visit  Shirley Fowler [id number] August 29, 1946 67 y.o. 10/26/2013 11:16 AM   Principle Diagnosis: Encounter Diagnoses  Name Primary?  . Stage II carcinoma of colon   . GIST (gastrointestinal stromal tumor), non-malignant   . 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 6-cm primary, grade not stated in report, 17 lymph nodes negative. Preoperative CEA normal. In addition to the primary tumor, she had a number of tubular adenomas and rectal polyps, some with dysplasia with additional polyps in the ascending colon.  Following surgical resection, she was treated with adjuvant oral Xeloda chemotherapy.  She did not want to participate in a clinical trial.  She developed some rectal bleeding in January of 2010. She was found to have a number of polyps in her colon and one large soft tissue mass in the transverse colon. She underwent another surgical procedure. 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  Most recent follow up colonoscopy on 02/20/10 by Dr. Volney Presser. She was told she had two small polyps which were removed. Otherwise the study was unremarkable.  She also has a history of an intraductal papilloma removed from her right breast.  She's had no interim medical problems. She denies any abdominal pain. No change in bowel habits. No hematochezia or melena.    Medications: reviewed  Allergies: No Known Allergies  Review of Systems:  ENT ROS: No sore throat Breast ROS no breast lumps Respiratory ROS: No cough or dyspnea Cardiovascular ROS:  No chest pain or palpitations Gastrointestinal ROS:  See above Genito-Urinary ROS: Not questioned Musculoskeletal ROS: No pain Neurological ROS: No headache or change in vision Dermatological  ROS: No rash Remaining ROS negative  Physical Exam: Blood pressure 132/80, pulse 88, temperature 98.2 F (36.8 C), temperature source Oral, resp. rate 20, height 5\' 6"  (1.676 m), weight 170 lb 1.6 oz (77.157 kg). Wt Readings from Last 3 Encounters:  10/26/13 170 lb 1.6 oz (77.157 kg)  10/27/12 179 lb 12.8 oz (81.557 kg)     General appearance: Well-nourished African American woman [name]: Pharynx no erythema, exudate, mass, or ulcer. No thyromegaly or thyroid nodules Lymph nodes: No cervical, supraclavicular, or axillary lymphadenopathy Breasts: Breasts not examined today. Entire family was in the room with 2 little children. Lungs: Clear to auscultation, resonant to percussion throughout Heart: Regular rhythm, no murmur, no gallop, no rub, no click, no edema Abdomen: Soft, nontender, normal bowel sounds, no mass, no organomegaly Extremities: No edema, no calf tenderness Musculoskeletal: no joint deformities GU:  Vascular: Carotid pulses 2+, no bruits,  Neurologic: Alert, oriented, PERRLA,  , cranial nerves grossly normal, motor strength 5 over 5, reflexes 1+ symmetric, upper body coordination normal, gait normal, Skin: No rash or ecchymosis  Lab Results: CBC W/Diff    Component Value Date/Time   WBC 5.0 10/26/2013 0945   RBC 4.41 10/26/2013 0945   HGB 12.9 10/26/2013 0945   HCT 39.4 10/26/2013 0945   PLT 230 10/26/2013 0945   MCV 89.3 10/26/2013 0945   MCH 29.3 10/26/2013 0945   MCH 30.4 09/11/2010 1130   MCHC 32.7 10/26/2013 0945   RDW 13.6 10/26/2013 0945   LYMPHSABS 1.8 10/26/2013 0945   MONOABS 0.4 10/26/2013 0945   EOSABS 0.1 10/26/2013 0945  BASOSABS 0.0 10/26/2013 0945     Chemistry      Component Value Date/Time   NA 140 10/26/2013 0945   NA 139 09/24/2011 1017   K 4.4 10/26/2013 0945   K 4.2 09/24/2011 1017   CL 110* 10/27/2012 1124   CL 106 09/24/2011 1017   CO2 24 10/26/2013 0945   CO2 23 09/24/2011 1017   BUN 9.8 10/26/2013 0945   BUN 9 09/24/2011  1017   CREATININE 0.9 10/26/2013 0945   CREATININE 0.84 09/24/2011 1017      Component Value Date/Time   CALCIUM 9.9 10/26/2013 0945   CALCIUM 10.1 09/24/2011 1017   ALKPHOS 83 10/26/2013 0945   ALKPHOS 83 09/24/2011 1017   AST 17 10/26/2013 0945   AST 16 09/24/2011 1017   ALT 15 10/26/2013 0945   ALT 14 09/24/2011 1017   BILITOT 0.47 10/26/2013 0945   BILITOT 0.4 09/24/2011 1017      Impression:  #1. Stage II cancer of the sigmoid colon she remains free of any obvious recurrence now out over 7 years from diagnosis in May 2007. Next colonoscopy due in the spring of 2016  #2. 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 Fibrocystic disease of the breast. Benign sclerosing papillomas right breast on biopsy May, 2009  She tells me she had a mammogram in October and this was reported to her as normal. She is willing to change them in [place] location to [place] and I will go and schedule one for this year.  #4. Essential hypertension  #5. Type 2 diabetes on oral agent     CC:. Dr Orma Flaming    CC: Patient has no care team.   Levert Feinstein, MD 11/28/201411:16 AM

## 2013-10-26 NOTE — Telephone Encounter (Signed)
worked 10/26/13 POF appts made for 1 yr w Dr Truett Perna  Mammogram order printed and given to pt to take to a facility closer to home shh

## 2014-10-28 ENCOUNTER — Other Ambulatory Visit (HOSPITAL_BASED_OUTPATIENT_CLINIC_OR_DEPARTMENT_OTHER): Payer: Medicare Other

## 2014-10-28 ENCOUNTER — Encounter: Payer: Self-pay | Admitting: Hematology

## 2014-10-28 ENCOUNTER — Ambulatory Visit: Payer: Medicare Other | Admitting: Oncology

## 2014-10-28 ENCOUNTER — Ambulatory Visit (HOSPITAL_BASED_OUTPATIENT_CLINIC_OR_DEPARTMENT_OTHER): Payer: Medicare Other | Admitting: Hematology

## 2014-10-28 ENCOUNTER — Other Ambulatory Visit: Payer: Medicare Other

## 2014-10-28 VITALS — BP 129/74 | HR 75 | Temp 97.6°F | Resp 18 | Ht 66.0 in | Wt 170.0 lb

## 2014-10-28 DIAGNOSIS — Z85038 Personal history of other malignant neoplasm of large intestine: Secondary | ICD-10-CM

## 2014-10-28 DIAGNOSIS — Z23 Encounter for immunization: Secondary | ICD-10-CM

## 2014-10-28 DIAGNOSIS — D241 Benign neoplasm of right breast: Secondary | ICD-10-CM

## 2014-10-28 DIAGNOSIS — D214 Benign neoplasm of connective and other soft tissue of abdomen: Secondary | ICD-10-CM

## 2014-10-28 DIAGNOSIS — C189 Malignant neoplasm of colon, unspecified: Secondary | ICD-10-CM

## 2014-10-28 LAB — COMPREHENSIVE METABOLIC PANEL (CC13)
ALBUMIN: 4 g/dL (ref 3.5–5.0)
ALK PHOS: 88 U/L (ref 40–150)
ALT: 15 U/L (ref 0–55)
ANION GAP: 9 meq/L (ref 3–11)
AST: 17 U/L (ref 5–34)
BUN: 11.7 mg/dL (ref 7.0–26.0)
CO2: 24 meq/L (ref 22–29)
CREATININE: 0.9 mg/dL (ref 0.6–1.1)
Calcium: 10.2 mg/dL (ref 8.4–10.4)
Chloride: 108 mEq/L (ref 98–109)
Glucose: 104 mg/dl (ref 70–140)
POTASSIUM: 4.4 meq/L (ref 3.5–5.1)
SODIUM: 141 meq/L (ref 136–145)
Total Bilirubin: 0.38 mg/dL (ref 0.20–1.20)
Total Protein: 7.2 g/dL (ref 6.4–8.3)

## 2014-10-28 LAB — CBC WITH DIFFERENTIAL/PLATELET
BASO%: 0.9 % (ref 0.0–2.0)
BASOS ABS: 0 10*3/uL (ref 0.0–0.1)
EOS%: 2 % (ref 0.0–7.0)
Eosinophils Absolute: 0.1 10*3/uL (ref 0.0–0.5)
HEMATOCRIT: 42.4 % (ref 34.8–46.6)
HGB: 13.7 g/dL (ref 11.6–15.9)
LYMPH%: 37.5 % (ref 14.0–49.7)
MCH: 29.1 pg (ref 25.1–34.0)
MCHC: 32.2 g/dL (ref 31.5–36.0)
MCV: 90.2 fL (ref 79.5–101.0)
MONO#: 0.6 10*3/uL (ref 0.1–0.9)
MONO%: 11.6 % (ref 0.0–14.0)
NEUT%: 48 % (ref 38.4–76.8)
NEUTROS ABS: 2.5 10*3/uL (ref 1.5–6.5)
Platelets: 225 10*3/uL (ref 145–400)
RBC: 4.71 10*6/uL (ref 3.70–5.45)
RDW: 14.3 % (ref 11.2–14.5)
WBC: 5.1 10*3/uL (ref 3.9–10.3)
lymph#: 1.9 10*3/uL (ref 0.9–3.3)

## 2014-10-28 LAB — LACTATE DEHYDROGENASE (CC13): LDH: 142 U/L (ref 125–245)

## 2014-10-28 MED ORDER — INFLUENZA VAC SPLIT QUAD 0.5 ML IM SUSY
0.5000 mL | PREFILLED_SYRINGE | Freq: Once | INTRAMUSCULAR | Status: AC
  Administered 2014-10-28: 0.5 mL via INTRAMUSCULAR
  Filled 2014-10-28: qty 0.5

## 2014-10-28 NOTE — Progress Notes (Signed)
Hematology and Oncology Follow Up Visit  Shirley Fowler [id number] 09/16/1946 68 y.o. 10/28/2014 8:56 AM   Principle Diagnosis: Encounter Diagnosis  Name Primary?  . Needs flu shot Yes    PREVIOUS HISTOPRY 1.  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 6-cm primary, grade not stated in report, 17 lymph nodes negative. Preoperative CEA normal. In addition to the primary tumor, she had a number of tubular adenomas and rectal polyps, some with dysplasia with additional polyps in the ascending colon. Following surgical resection, she was treated with adjuvant oral Xeloda chemotherapy.  2. She developed some rectal bleeding in January of 2010. She was found to have a number of polyps in her colon and one large soft tissue mass in the transverse colon. She underwent another surgical procedure. 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  3. Papillomas right breast on biopsy May, 2009   INTRIM HISTORY: She's had no interim medical problems. She feels well in general.  She denies any abdominal pain. No change in bowel habits. No hematochezia or melena.  Lat mammogram was normal in 3-4 month ago.Most recent follow up colonoscopy on 02/20/10 by Dr. Shelva Majestic. She was told she had two small polyps which were removed. Otherwise the study was unremarkable.    Medications: reviewed  Allergies: No Known Allergies  Review of Systems:  ENT ROS: No sore throat Breast ROS no breast lumps Respiratory ROS: No cough or dyspnea Cardiovascular ROS:  No chest pain or palpitations Gastrointestinal ROS:  See above Genito-Urinary ROS: Not questioned Musculoskeletal ROS: No pain Neurological ROS: No headache or change in vision Dermatological ROS: No rash Remaining ROS negative  Physical Exam: Blood pressure 129/74, pulse 75, temperature 97.6 F (36.4 C), temperature source Oral, resp. rate 18, height 5\' 6"  (1.676 m), weight 170 lb  (77.111 kg), SpO2 98 %. Wt Readings from Last 3 Encounters:  10/28/14 170 lb (77.111 kg)  10/26/13 170 lb 1.6 oz (77.157 kg)  10/27/12 179 lb 12.8 oz (81.557 kg)     General appearance: Well-nourished African American woman [name]: Pharynx no erythema, exudate, mass, or ulcer. No thyromegaly or thyroid nodules Lymph nodes: No cervical, supraclavicular, or axillary lymphadenopathy Breasts: Breasts not examined today. Entire family was in the room with 2 little children. Lungs: Clear to auscultation, resonant to percussion throughout Heart: Regular rhythm, no murmur, no gallop, no rub, no click, no edema Abdomen: Soft, nontender, normal bowel sounds, no mass, no organomegaly Extremities: No edema, no calf tenderness Musculoskeletal: no joint deformities GU:  Vascular: Carotid pulses 2+, no bruits,  Neurologic: Alert, oriented, PERRLA,  , cranial nerves grossly normal, motor strength 5 over 5, reflexes 1+ symmetric, upper body coordination normal, gait normal, Skin: No rash or ecchymosis  Lab Results: CBC W/Diff    Component Value Date/Time   WBC 5.1 10/28/2014 0806   RBC 4.71 10/28/2014 0806   HGB 13.7 10/28/2014 0806   HCT 42.4 10/28/2014 0806   PLT 225 10/28/2014 0806   MCV 90.2 10/28/2014 0806   MCH 29.1 10/28/2014 0806   MCH 30.4 09/11/2010 1130   MCHC 32.2 10/28/2014 0806   RDW 14.3 10/28/2014 0806   LYMPHSABS 1.9 10/28/2014 0806   MONOABS 0.6 10/28/2014 0806   EOSABS 0.1 10/28/2014 0806   BASOSABS 0.0 10/28/2014 0806     Chemistry      Component Value Date/Time   NA 140 10/26/2013 0945  NA 139 09/24/2011 1017   K 4.4 10/26/2013 0945   K 4.2 09/24/2011 1017   CL 110* 10/27/2012 1124   CL 106 09/24/2011 1017   CO2 24 10/26/2013 0945   CO2 23 09/24/2011 1017   BUN 9.8 10/26/2013 0945   BUN 9 09/24/2011 1017   CREATININE 0.9 10/26/2013 0945   CREATININE 0.84 09/24/2011 1017      Component Value Date/Time   CALCIUM 9.9 10/26/2013 0945   CALCIUM 10.1 09/24/2011  1017   ALKPHOS 83 10/26/2013 0945   ALKPHOS 83 09/24/2011 1017   AST 17 10/26/2013 0945   AST 16 09/24/2011 1017   ALT 15 10/26/2013 0945   ALT 14 09/24/2011 1017   BILITOT 0.47 10/26/2013 0945   BILITOT 0.4 09/24/2011 1017      Impression:  #1. 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 Risk of recurrence is minium. Next colonoscopy due in the spring of 2016.  #2. 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 Fibrocystic disease of the breast. Benign sclerosing papillomas right breast on biopsy May, 2009  She tells me she had a mammogram 3-4 months ago and this was reported to her as normal.   #4. Essential hypertension   #5. Type 2 diabetes on oral agent   Plan: She still prefers to follow up with me on a yearly bases, although I suggested she can follow up with her PCP alone.  Will see her back in one year with labs.   Truitt Merle, MD 11/30/20158:56 AM

## 2014-10-29 ENCOUNTER — Telehealth: Payer: Self-pay | Admitting: *Deleted

## 2014-10-29 NOTE — Telephone Encounter (Signed)
Pt called to inform office of her primary md  -  Dr.  Jonathon Bellows  In  [place].                                                                                [address]  [place], [place]  [address]                                                                                [phone].

## 2014-11-11 ENCOUNTER — Telehealth: Payer: Self-pay | Admitting: Hematology

## 2014-11-11 NOTE — Telephone Encounter (Signed)
Faxed pt labs to [facility]

## 2015-04-02 ENCOUNTER — Telehealth: Payer: Self-pay | Admitting: *Deleted

## 2015-04-02 NOTE — Telephone Encounter (Signed)
Patient called regarding obtaining new PCP.  She will want her records sent to this new PCP. Informed pt that when she gets established with new pcp to call us back and we will have records fax'd to him/her.  Patient voiced understanding and will call us back with that information.

## 2016-02-02 ENCOUNTER — Other Ambulatory Visit: Payer: Self-pay | Admitting: *Deleted

## 2016-02-03 ENCOUNTER — Telehealth: Payer: Self-pay | Admitting: Hematology

## 2016-02-03 NOTE — Telephone Encounter (Signed)
Left message for patient for follow up appt 3/23 pr 3/7 pof

## 2016-02-19 ENCOUNTER — Ambulatory Visit: Payer: Medicare Other | Admitting: Hematology

## 2016-02-19 ENCOUNTER — Other Ambulatory Visit: Payer: Medicare Other

## 2016-02-25 ENCOUNTER — Telehealth: Payer: Self-pay | Admitting: Hematology

## 2016-02-25 ENCOUNTER — Ambulatory Visit (HOSPITAL_BASED_OUTPATIENT_CLINIC_OR_DEPARTMENT_OTHER): Payer: Medicare Other | Admitting: Hematology

## 2016-02-25 ENCOUNTER — Other Ambulatory Visit (HOSPITAL_BASED_OUTPATIENT_CLINIC_OR_DEPARTMENT_OTHER): Payer: Medicare Other

## 2016-02-25 ENCOUNTER — Encounter: Payer: Self-pay | Admitting: Hematology

## 2016-02-25 VITALS — BP 146/85 | HR 109 | Temp 98.0°F | Resp 18 | Ht 66.0 in | Wt 165.4 lb

## 2016-02-25 DIAGNOSIS — I10 Essential (primary) hypertension: Secondary | ICD-10-CM | POA: Diagnosis not present

## 2016-02-25 DIAGNOSIS — Z85038 Personal history of other malignant neoplasm of large intestine: Secondary | ICD-10-CM | POA: Diagnosis present

## 2016-02-25 DIAGNOSIS — E119 Type 2 diabetes mellitus without complications: Secondary | ICD-10-CM

## 2016-02-25 DIAGNOSIS — Z23 Encounter for immunization: Secondary | ICD-10-CM

## 2016-02-25 DIAGNOSIS — C189 Malignant neoplasm of colon, unspecified: Secondary | ICD-10-CM

## 2016-02-25 LAB — COMPREHENSIVE METABOLIC PANEL
ALK PHOS: 83 U/L (ref 40–150)
ALT: 16 U/L (ref 0–55)
AST: 17 U/L (ref 5–34)
Albumin: 3.9 g/dL (ref 3.5–5.0)
Anion Gap: 12 mEq/L — ABNORMAL HIGH (ref 3–11)
BILIRUBIN TOTAL: 0.42 mg/dL (ref 0.20–1.20)
BUN: 7.4 mg/dL (ref 7.0–26.0)
CO2: 26 mEq/L (ref 22–29)
Calcium: 10.1 mg/dL (ref 8.4–10.4)
Chloride: 106 mEq/L (ref 98–109)
Creatinine: 1 mg/dL (ref 0.6–1.1)
EGFR: 68 mL/min/{1.73_m2} — AB (ref >90)
Glucose: 139 mg/dl (ref 70–140)
POTASSIUM: 4.2 meq/L (ref 3.5–5.1)
Sodium: 144 mEq/L (ref 136–145)
TOTAL PROTEIN: 7.5 g/dL (ref 6.4–8.3)

## 2016-02-25 LAB — CBC WITH DIFFERENTIAL/PLATELET
BASO%: 1.3 % (ref 0.0–2.0)
BASOS ABS: 0.1 10*3/uL (ref 0.0–0.1)
EOS ABS: 0 10*3/uL (ref 0.0–0.5)
EOS%: 0.8 % (ref 0.0–7.0)
HCT: 41.7 % (ref 34.8–46.6)
HEMOGLOBIN: 13.6 g/dL (ref 11.6–15.9)
LYMPH%: 28.8 % (ref 14.0–49.7)
MCH: 29.1 pg (ref 25.1–34.0)
MCHC: 32.6 g/dL (ref 31.5–36.0)
MCV: 89.3 fL (ref 79.5–101.0)
MONO#: 0.4 10*3/uL (ref 0.1–0.9)
MONO%: 8.2 % (ref 0.0–14.0)
NEUT#: 3.2 10*3/uL (ref 1.5–6.5)
NEUT%: 60.9 % (ref 38.4–76.8)
Platelets: 230 10*3/uL (ref 145–400)
RBC: 4.67 10*6/uL (ref 3.70–5.45)
RDW: 13.7 % (ref 11.2–14.5)
WBC: 5.3 10*3/uL (ref 3.9–10.3)
lymph#: 1.5 10*3/uL (ref 0.9–3.3)

## 2016-02-25 NOTE — Telephone Encounter (Signed)
per pof to sch pt appt-mailed pof per pt req

## 2016-02-25 NOTE — Progress Notes (Signed)
Hematology and Oncology Follow Up Visit  Shirley Fowler [id number] 16-Jun-1946 70 y.o. 02/25/2016 9:47 AM   Principle Diagnosis: Encounter Diagnosis  Name Primary?  . Stage II carcinoma of colon ([place]) Yes    PREVIOUS HISTOPRY 1.  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 6-cm primary, grade not stated in report, 17 lymph nodes negative. Preoperative CEA normal. In addition to the primary tumor, she had a number of tubular adenomas and rectal polyps, some with dysplasia with additional polyps in the ascending colon. Following surgical resection, she was treated with adjuvant oral Xeloda chemotherapy.  2. She developed some rectal bleeding in January of 2010. She was found to have a number of polyps in her colon and one large soft tissue mass in the transverse colon. She underwent another surgical procedure. 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  3. 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 She has been struggling with the left rotator cuff for the past 6 months, she had surgery appear in generally 2017, but still has significant pain after surgery, and has not been able to use her left hand much due to the pain, she has been doing physical therapy daily. She otherwise is doing well, denies any abdominal discomfort, bloating, nausea, diarrhea, or change of her bowel habits. She had her most recent follow-up colonoscopy on June 12, 2015 by Dr. Shelva Majestic, which was negative except hemorrhoids.   Medications: reviewed   Medication List       This list is accurate as of: 02/25/16 10:09 AM.  Always use your most recent med list.               ALPRAZolam 0.25 MG tablet  Commonly known as:  XANAX  TK 1 T PO Q 6 H PRF ANXIETY     CADUET 5-10 MG tablet  Generic drug:  amLODipine-atorvastatin  Take 1 tablet by mouth daily.      CENTRUM SILVER ADULT 50+ PO  Take 1 tablet by mouth daily.     metFORMIN 500 MG tablet  Commonly known as:  GLUCOPHAGE  Take 500 mg by mouth 3 (three) times daily.     oxyCODONE-acetaminophen 5-325 MG tablet  Commonly known as:  PERCOCET/ROXICET  TK 1 TO 2 TS PO Q 4 TO 6 H PRN     OXYCONTIN 10 mg 12 hr tablet  Generic drug:  oxyCODONE  TK 1 T PO Q 12 H PRN P         Allergies: No Known Allergies  Review of Systems:  ENT ROS: No sore throat Breast ROS no breast lumps Respiratory ROS: No cough or dyspnea Cardiovascular ROS:  No chest pain or palpitations Gastrointestinal ROS:  See above Genito-Urinary ROS: Not questioned Musculoskeletal ROS: No pain Neurological ROS: No headache or change in vision Dermatological ROS: No rash Remaining ROS negative  Physical Exam: Blood pressure 146/85, pulse 109, temperature 98 F (36.7 C), temperature source Oral, resp. rate 18, height 5\' 6"  (1.676 m), weight 165 lb 6.4 oz (75.025 kg), SpO2 98 %. Wt Readings from Last 3 Encounters:  02/25/16 165 lb 6.4 oz (75.025 kg)  10/28/14 170 lb (77.111 kg)  10/26/13 170 lb 1.6 oz (77.157 kg)   General appearance: Well-nourished African American woman [name]: Pharynx no erythema, exudate, mass, or ulcer. No thyromegaly or thyroid nodules  Lymph nodes: No cervical, supraclavicular, or axillary lymphadenopathy Breasts: Breasts not examined today. Entire family was in the room with 2 little children. Lungs: Clear to auscultation, resonant to percussion throughout Heart: Regular rhythm, no murmur, no gallop, no rub, no click, no edema Abdomen: Soft, nontender, normal bowel sounds, no mass, no organomegaly Extremities: No edema, no calf tenderness Musculoskeletal: no joint deformities Neurologic: Alert, oriented, PERRLA,  , cranial nerves grossly normal, motor strength 5 over 5, reflexes 1+ symmetric, upper body coordination normal, gait normal, Skin: No rash or ecchymosis  Lab Results: CBC  Latest Ref Rng 02/25/2016 10/28/2014 10/26/2013  WBC 3.9 - 10.3 10e3/uL 5.3 5.1 5.0  Hemoglobin 11.6 - 15.9 g/dL 13.6 13.7 12.9  Hematocrit 34.8 - 46.6 % 41.7 42.4 39.4  Platelets 145 - 400 10e3/uL 230 225 230    CMP Latest Ref Rng 10/28/2014 10/26/2013 10/27/2012  Glucose 70 - 140 mg/dl 104 106 120(H)  BUN 7.0 - 26.0 mg/dL 11.7 9.8 8.0  Creatinine 0.6 - 1.1 mg/dL 0.9 0.9 1.0  Sodium 136 - 145 mEq/L 141 140 143  Potassium 3.5 - 5.1 mEq/L 4.4 4.4 4.3  Chloride 98 - 107 mEq/L - - 110(H)  CO2 22 - 29 mEq/L 24 24 26   Calcium 8.4 - 10.4 mg/dL 10.2 9.9 9.7  Total Protein 6.4 - 8.3 g/dL 7.2 7.4 6.9  Total Bilirubin 0.20 - 1.20 mg/dL 0.38 0.47 0.45  Alkaline Phos 40 - 150 U/L 88 83 84  AST 5 - 34 U/L 17 17 16   ALT 0 - 55 U/L 15 15 20      Impression and plan: 70 year old female   1. 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 -Her last colonoscopy in July 2016 was negative -She has completed surveillance, normal risk for cancer recurrence. -will stop CEA screening test   2. 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 Colonoscopy in 2016 was negative.  3. Fibrocystic disease of the breast. 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 HTN, Type 2 diabetes on oral agent  -She'll continue follow-up with her primary care physician  5. Left rotator cuff, status post surgical repair -She still has significant pain and limited range of motion, we'll continue physical therapy.  Plan: She still prefers to follow up with me on a yearly bases, although I suggested she can follow up with her PCP alone.  Will see her back in one year with labs.   Truitt Merle, MD 3/29/20179:47 AM

## 2016-02-26 LAB — CEA: CEA: 3.1 ng/mL (ref 0.0–4.7)

## 2016-03-05 ENCOUNTER — Telehealth: Payer: Self-pay | Admitting: *Deleted

## 2016-03-05 NOTE — Telephone Encounter (Signed)
Spoke with pt and informed pt re:  Tumor marker CEA level normal as per Dr. Ernestina Penna instructions.  Pt voiced understanding.

## 2017-02-24 ENCOUNTER — Other Ambulatory Visit: Payer: Medicare Other

## 2017-02-24 ENCOUNTER — Ambulatory Visit: Payer: Medicare Other | Admitting: Hematology

## 2017-02-24 NOTE — Progress Notes (Signed)
Hematology and Oncology Follow Up Visit  Shirley Fowler [id number] 1946-07-28 71 y.o. 03/03/2017 11:35 PM   Principle Diagnosis: Encounter Diagnosis  Name Primary?  . 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 6-cm primary, grade not stated in report, 17 lymph nodes negative. Preoperative CEA normal. In addition to the primary tumor, she had a number of tubular adenomas and rectal polyps, some with dysplasia with additional polyps in the ascending colon. Following surgical resection, she was treated with adjuvant oral Xeloda chemotherapy.  2. She developed some rectal bleeding in January of 2010. She was found to have a number of polyps in her colon and one large soft tissue mass in the transverse colon. She underwent another surgical procedure. 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  3. 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  Patient is accompanied by son and reports things are going well. She is still experincing pain with her rotator cuff and has had surgery. She denies nausea, trouble with stomach or abnormal bowel movements.   - last colonoscopy 06/12/15 with Dr. Angelyn Punt - results were normal - still doing mammogram once yearly - labs were reviewed, everything looks good. A copy was provided   Medications: reviewed Allergies as of 03/03/2017   No Known Allergies     Medication List       Accurate as of 03/03/17 11:35 PM. Always use your most recent med list.          ALPRAZolam 0.25 MG tablet Commonly known as:  XANAX TK 1 T PO Q 6 H PRF ANXIETY   CADUET 5-10 MG tablet Generic drug:  amLODipine-atorvastatin Take 1 tablet by mouth daily.   CENTRUM SILVER ADULT 50+ PO Take 1 tablet by mouth daily.   metFORMIN 500 MG tablet Commonly known as:   GLUCOPHAGE Take 500 mg by mouth 3 (three) times daily.   oxyCODONE-acetaminophen 5-325 MG tablet Commonly known as:  PERCOCET/ROXICET TK 1 TO 2 TS PO Q 4 TO 6 H PRN   OXYCONTIN 10 mg 12 hr tablet Generic drug:  oxyCODONE TK 1 T PO Q 12 H PRN P        Allergies: No Known Allergies  Review of Systems:   ENT ROS: No sore throat Breast ROS no breast lumps Respiratory ROS: No cough or dyspnea Cardiovascular ROS:  No chest pain or palpitations Gastrointestinal ROS:  See above Musculoskeletal ROS: No pain Neurological ROS: No headache or change in vision Dermatological ROS: No rash Remaining ROS negative   Physical Exam:  Blood pressure 128/66, pulse 74, temperature 97.7 F (36.5 C), temperature source Oral, resp. rate 18, height 5\' 6"  (1.676 m), weight 160 lb 3.2 oz (72.7 kg), SpO2 100 %. Wt Readings from Last 3 Encounters:  03/03/17 160 lb 3.2 oz (72.7 kg)  02/25/16 165 lb 6.4 oz (75 kg)  10/28/14 170 lb (77.1 kg)   General appearance: Well-nourished African American woman [name]: Pharynx no erythema, exudate, mass, or ulcer. No thyromegaly or thyroid nodules Lymph nodes: No cervical, supraclavicular, or axillary lymphadenopathy Breasts: Breasts not examined today. Entire family was in the room with 2 little children. Lungs: Clear to auscultation, resonant to percussion throughout Heart: Regular rhythm, no murmur, no gallop, no rub, no click, no  edema Abdomen: Soft, nontender, normal bowel sounds, no mass, no organomegaly Extremities: No edema, no calf tenderness Musculoskeletal: no joint deformities Neurologic: Alert, oriented, PERRLA,  , cranial nerves grossly normal, motor strength 5 over 5, reflexes 1+ symmetric, upper body coordination normal, gait normal, Skin: No rash or ecchymosis  Physical Exam   Lab Results: CBC Latest Ref Rng & Units 03/03/2017 02/25/2016 10/28/2014  WBC 3.9 - 10.3 10e3/uL 5.9 5.3 5.1  Hemoglobin 11.6 - 15.9 g/dL 14.0 13.6 13.7  Hematocrit  34.8 - 46.6 % 42.0 41.7 42.4  Platelets 145 - 400 10e3/uL 250 230 225    CMP Latest Ref Rng & Units 03/03/2017 02/25/2016 10/28/2014  Glucose 70 - 140 mg/dl 93 139 104  BUN 7.0 - 26.0 mg/dL 10.1 7.4 11.7  Creatinine 0.6 - 1.1 mg/dL 1.0 1.0 0.9  Sodium 136 - 145 mEq/L 143 144 141  Potassium 3.5 - 5.1 mEq/L 4.2 4.2 4.4  Chloride 98 - 107 mEq/L - - -  CO2 22 - 29 mEq/L 25 26 24   Calcium 8.4 - 10.4 mg/dL 10.3 10.1 10.2  Total Protein 6.4 - 8.3 g/dL 7.7 7.5 7.2  Total Bilirubin 0.20 - 1.20 mg/dL 0.37 0.42 0.38  Alkaline Phos 40 - 150 U/L 93 83 88  AST 5 - 34 U/L 16 17 17   ALT 0 - 55 U/L 13 16 15      Impression and plan: 71 y.o. female   1. 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 -Her last colonoscopy in July 2016 was negative -She has completed surveillance, normal risk for cancer recurrence. -will stop CEA screening test   last colonoscopy 06/12/15 with Dr. Angelyn Punt - results were normal - labs were reviewed, everything looks good. A copy was provided - She sees PCP every 6 months, she will follow up with him. I advised the patient there is no need to continue visiting the [facility] but she can call if she has any questions or concerns  2. 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 Colonoscopy in 2016 was negative.  3. Fibrocystic disease of the breast. 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 HTN, Type 2 diabetes on oral agent  -She'll continue follow-up with her primary care physician  5. Left rotator cuff, status post surgical repair - Patient still experiences pain with her left rotator cuff, she has just finished her physical therapy  Plan: -She has completed cancer  surveillance. I will discharge the patient -she will follow up with PCP    This document serves as a record of services personally performed by Truitt Merle, MD. It was created on her behalf by Brandt Loosen, a trained medical scribe. The creation of this record is based on the scribe's personal observations and the provider's statements to them. This document has been checked and approved by the attending provider.   Truitt Merle, MD 03/03/2017

## 2017-03-03 ENCOUNTER — Ambulatory Visit (HOSPITAL_BASED_OUTPATIENT_CLINIC_OR_DEPARTMENT_OTHER): Payer: Medicare Other | Admitting: Hematology

## 2017-03-03 ENCOUNTER — Other Ambulatory Visit (HOSPITAL_BASED_OUTPATIENT_CLINIC_OR_DEPARTMENT_OTHER): Payer: Medicare Other

## 2017-03-03 ENCOUNTER — Encounter: Payer: Self-pay | Admitting: Hematology

## 2017-03-03 VITALS — BP 128/66 | HR 74 | Temp 97.7°F | Resp 18 | Ht 66.0 in | Wt 160.2 lb

## 2017-03-03 DIAGNOSIS — E119 Type 2 diabetes mellitus without complications: Secondary | ICD-10-CM | POA: Diagnosis not present

## 2017-03-03 DIAGNOSIS — M75102 Unspecified rotator cuff tear or rupture of left shoulder, not specified as traumatic: Secondary | ICD-10-CM | POA: Diagnosis not present

## 2017-03-03 DIAGNOSIS — N6019 Diffuse cystic mastopathy of unspecified breast: Secondary | ICD-10-CM

## 2017-03-03 DIAGNOSIS — Z85038 Personal history of other malignant neoplasm of large intestine: Secondary | ICD-10-CM

## 2017-03-03 DIAGNOSIS — I10 Essential (primary) hypertension: Secondary | ICD-10-CM

## 2017-03-03 DIAGNOSIS — D214 Benign neoplasm of connective and other soft tissue of abdomen: Secondary | ICD-10-CM

## 2017-03-03 DIAGNOSIS — C189 Malignant neoplasm of colon, unspecified: Secondary | ICD-10-CM

## 2017-03-03 LAB — COMPREHENSIVE METABOLIC PANEL
ALT: 13 U/L (ref 0–55)
ANION GAP: 9 meq/L (ref 3–11)
AST: 16 U/L (ref 5–34)
Albumin: 4.1 g/dL (ref 3.5–5.0)
Alkaline Phosphatase: 93 U/L (ref 40–150)
BILIRUBIN TOTAL: 0.37 mg/dL (ref 0.20–1.20)
BUN: 10.1 mg/dL (ref 7.0–26.0)
CALCIUM: 10.3 mg/dL (ref 8.4–10.4)
CHLORIDE: 109 meq/L (ref 98–109)
CO2: 25 meq/L (ref 22–29)
CREATININE: 1 mg/dL (ref 0.6–1.1)
EGFR: 69 mL/min/{1.73_m2} — AB (ref >90)
Glucose: 93 mg/dl (ref 70–140)
Potassium: 4.2 mEq/L (ref 3.5–5.1)
Sodium: 143 mEq/L (ref 136–145)
TOTAL PROTEIN: 7.7 g/dL (ref 6.4–8.3)

## 2017-03-03 LAB — CBC WITH DIFFERENTIAL/PLATELET
BASO%: 0.8 % (ref 0.0–2.0)
BASOS ABS: 0.1 10*3/uL (ref 0.0–0.1)
EOS%: 1 % (ref 0.0–7.0)
Eosinophils Absolute: 0.1 10*3/uL (ref 0.0–0.5)
HCT: 42 % (ref 34.8–46.6)
HGB: 14 g/dL (ref 11.6–15.9)
LYMPH%: 36.9 % (ref 14.0–49.7)
MCH: 29.6 pg (ref 25.1–34.0)
MCHC: 33.3 g/dL (ref 31.5–36.0)
MCV: 89.1 fL (ref 79.5–101.0)
MONO#: 0.6 10*3/uL (ref 0.1–0.9)
MONO%: 9.8 % (ref 0.0–14.0)
NEUT#: 3 10*3/uL (ref 1.5–6.5)
NEUT%: 51.5 % (ref 38.4–76.8)
Platelets: 250 10*3/uL (ref 145–400)
RBC: 4.71 10*6/uL (ref 3.70–5.45)
RDW: 14.1 % (ref 11.2–14.5)
WBC: 5.9 10*3/uL (ref 3.9–10.3)
lymph#: 2.2 10*3/uL (ref 0.9–3.3)
